# Patient Record
Sex: Female | Born: 1980 | Race: White | Hispanic: No | Marital: Single | Smoking: Never smoker
Health system: Southern US, Community
[De-identification: ages and names within clinical notes are randomized; demographics above are authoritative.]

## PROBLEM LIST (undated history)

## (undated) DIAGNOSIS — Z8719 Personal history of other diseases of the digestive system: Secondary | ICD-10-CM

## (undated) HISTORY — DX: Personal history of other diseases of the digestive system: Z87.19

## (undated) HISTORY — PX: TONSILLECTOMY: SUR1361

---

## 1999-08-27 ENCOUNTER — Other Ambulatory Visit: Admission: RE | Admit: 1999-08-27 | Discharge: 1999-08-27 | Payer: Self-pay | Admitting: *Deleted

## 2001-07-19 ENCOUNTER — Other Ambulatory Visit: Admission: RE | Admit: 2001-07-19 | Discharge: 2001-07-19 | Payer: Self-pay | Admitting: Obstetrics and Gynecology

## 2004-11-02 ENCOUNTER — Ambulatory Visit: Payer: Self-pay | Admitting: Unknown Physician Specialty

## 2005-01-04 HISTORY — PX: TONSILLECTOMY: SUR1361

## 2006-12-04 ENCOUNTER — Ambulatory Visit: Payer: Self-pay | Admitting: Family Medicine

## 2007-11-24 LAB — CONVERTED CEMR LAB: Pap Smear: NORMAL

## 2008-02-24 ENCOUNTER — Emergency Department (HOSPITAL_COMMUNITY): Admission: EM | Admit: 2008-02-24 | Discharge: 2008-02-24 | Payer: Self-pay | Admitting: Emergency Medicine

## 2008-09-27 ENCOUNTER — Ambulatory Visit: Payer: Self-pay | Admitting: Family Medicine

## 2008-09-27 DIAGNOSIS — R5383 Other fatigue: Secondary | ICD-10-CM

## 2008-09-27 DIAGNOSIS — R5381 Other malaise: Secondary | ICD-10-CM | POA: Insufficient documentation

## 2008-09-30 LAB — CONVERTED CEMR LAB
Basophils Absolute: 0 10*3/uL (ref 0.0–0.1)
CO2: 27 meq/L (ref 19–32)
Calcium: 9.3 mg/dL (ref 8.4–10.5)
Cholesterol: 184 mg/dL (ref 0–200)
Creatinine, Ser: 0.9 mg/dL (ref 0.4–1.2)
Eosinophils Absolute: 0.1 10*3/uL (ref 0.0–0.7)
HCT: 43.7 % (ref 36.0–46.0)
LDL Cholesterol: 84 mg/dL (ref 0–99)
Lymphocytes Relative: 24.6 % (ref 12.0–46.0)
Lymphs Abs: 2 10*3/uL (ref 0.7–4.0)
MCHC: 33.8 g/dL (ref 30.0–36.0)
Monocytes Relative: 4.6 % (ref 3.0–12.0)
Platelets: 272 10*3/uL (ref 150.0–400.0)
RDW: 12.1 % (ref 11.5–14.6)
Sodium: 141 meq/L (ref 135–145)
TSH: 0.76 microintl units/mL (ref 0.35–5.50)
Triglycerides: 147 mg/dL (ref 0.0–149.0)

## 2009-01-02 ENCOUNTER — Emergency Department (HOSPITAL_COMMUNITY): Admission: EM | Admit: 2009-01-02 | Discharge: 2009-01-02 | Payer: Self-pay | Admitting: Family Medicine

## 2009-01-04 DIAGNOSIS — Z8711 Personal history of peptic ulcer disease: Secondary | ICD-10-CM

## 2009-01-04 HISTORY — DX: Personal history of peptic ulcer disease: Z87.11

## 2009-01-13 ENCOUNTER — Ambulatory Visit: Payer: Self-pay | Admitting: Family Medicine

## 2009-01-13 DIAGNOSIS — J018 Other acute sinusitis: Secondary | ICD-10-CM | POA: Insufficient documentation

## 2009-01-15 ENCOUNTER — Telehealth: Payer: Self-pay | Admitting: Family Medicine

## 2009-04-21 ENCOUNTER — Telehealth: Payer: Self-pay | Admitting: Internal Medicine

## 2009-04-21 ENCOUNTER — Telehealth (INDEPENDENT_AMBULATORY_CARE_PROVIDER_SITE_OTHER): Payer: Self-pay | Admitting: *Deleted

## 2009-04-22 ENCOUNTER — Ambulatory Visit: Payer: Self-pay | Admitting: Internal Medicine

## 2009-04-22 ENCOUNTER — Encounter (INDEPENDENT_AMBULATORY_CARE_PROVIDER_SITE_OTHER): Payer: Self-pay | Admitting: *Deleted

## 2009-04-22 DIAGNOSIS — R1013 Epigastric pain: Secondary | ICD-10-CM | POA: Insufficient documentation

## 2009-04-22 LAB — CONVERTED CEMR LAB
ALT: 13 units/L (ref 0–35)
AST: 17 units/L (ref 0–37)
BUN: 6 mg/dL (ref 6–23)
Basophils Absolute: 0 10*3/uL (ref 0.0–0.1)
Basophils Relative: 0.4 % (ref 0.0–3.0)
Calcium: 9.3 mg/dL (ref 8.4–10.5)
Creatinine, Ser: 0.9 mg/dL (ref 0.4–1.2)
Eosinophils Absolute: 0.2 10*3/uL (ref 0.0–0.7)
HCT: 40 % (ref 36.0–46.0)
Hemoglobin: 14 g/dL (ref 12.0–15.0)
Lipase: 23 units/L (ref 11.0–59.0)
Lymphs Abs: 2.5 10*3/uL (ref 0.7–4.0)
MCHC: 35.1 g/dL (ref 30.0–36.0)
MCV: 88.2 fL (ref 78.0–100.0)
Monocytes Absolute: 0.4 10*3/uL (ref 0.1–1.0)
Neutro Abs: 5.5 10*3/uL (ref 1.4–7.7)
RBC: 4.54 M/uL (ref 3.87–5.11)
RDW: 12.9 % (ref 11.5–14.6)
Total Bilirubin: 0.3 mg/dL (ref 0.3–1.2)

## 2009-04-23 ENCOUNTER — Ambulatory Visit: Payer: Self-pay | Admitting: Internal Medicine

## 2010-02-03 ENCOUNTER — Ambulatory Visit
Admission: RE | Admit: 2010-02-03 | Discharge: 2010-02-03 | Payer: Self-pay | Source: Home / Self Care | Attending: Family Medicine | Admitting: Family Medicine

## 2010-02-03 DIAGNOSIS — J069 Acute upper respiratory infection, unspecified: Secondary | ICD-10-CM | POA: Insufficient documentation

## 2010-02-03 LAB — CONVERTED CEMR LAB: Rapid Strep: NEGATIVE

## 2010-02-03 NOTE — Miscellaneous (Signed)
Summary: Samples Dexilant  Clinical Lists Changes  Medications: Added new medication of DEXILANT 60 MG CPDR (DEXLANSOPRAZOLE) take one by mouth once daily 

## 2010-02-03 NOTE — Letter (Signed)
Summary: EGD Instructions  [facility]  [address] [place], [place] [address]   Phone: [phone]  Fax: [phone]       Sarah Hill    [birth year]    MRN: [id number]       Procedure Day Dorna Bloom: Wednesday April 20th, 2011     Arrival Time:  3:30pm     Procedure Time: 4:30pm     Location of Procedure:                    _ x _ [facility] (4th Floor)    PREPARATION FOR ENDOSCOPY   On 04/23/09 THE DAY OF THE PROCEDURE:  1.   No solid foods, milk or milk products are allowed after midnight the night before your procedure.  2.   Do not drink anything colored red or purple.  Avoid juices with pulp.  No orange juice.  3.  You may drink clear liquids until 2:30pm, which is 2 hours before your procedure.                                                                                                CLEAR LIQUIDS INCLUDE: Water Jello Ice Popsicles Tea (sugar ok, no milk/cream) Powdered fruit flavored drinks Coffee (sugar ok, no milk/cream) Gatorade Juice: apple, white grape, white cranberry  Lemonade Clear bullion, consomm, broth Carbonated beverages (any kind) Strained chicken noodle soup Hard Candy   MEDICATION INSTRUCTIONS  Unless otherwise instructed, you should take regular prescription medications with a small sip of water as early as possible the morning of your procedure.         OTHER INSTRUCTIONS  You will need a responsible adult at least 32 years of age to accompany you and drive you home.   This person must remain in the waiting room during your procedure.  Wear loose fitting clothing that is easily removed.  Leave jewelry and other valuables at home.  However, you may wish to bring a book to read or an iPod/MP3 player to listen to music as you wait for your procedure to start.  Remove all body piercing jewelry and leave at home.  Total time from sign-in until discharge is approximately 2-3 hours.  You should go home  directly after your procedure and rest.  You can resume normal activities the day after your procedure.  The day of your procedure you should not:   Drive   Make legal decisions   Operate machinery   Drink alcohol   Return to work  You will receive specific instructions about eating, activities and medications before you leave.    The above instructions have been reviewed and explained to me by   Judeth Cornfield.    I fully understand and can verbalize these instructions _____________________________ Date _________

## 2010-02-03 NOTE — Progress Notes (Signed)
Summary: Still sick  Phone Note Call from Patient Call back at [phone] or [phone]   Caller: Patient Call For: Ruthe Mannan MD Summary of Call: Was in on Monday and was told to call back if not better. Patient states that she is feeling some better, but still coughing and coughed up a lot of green mucus and not able to keep anything down except Gatorade.. Patient states that she does not have a fever and has not had one the whole time that she has been sick.  [facility] Initial call taken by: Sydell Axon LPN,  January 15, 2009 12:10 PM  Follow-up for Phone Call        continue the amox as px  increase fluid intake - and consider mucinex to loosen mucous in chest  if no imp by tomorrow -- f/u so we can re check lungs update earlier if symptoms worsen Follow-up by: Judith Part MD,  January 15, 2009 1:18 PM  Additional Follow-up for Phone Call Additional follow up Details #1::        Advised pt. Additional Follow-up by: Lowella Petties CMA,  January 15, 2009 2:22 PM

## 2010-02-03 NOTE — Assessment & Plan Note (Signed)
Summary: EPIGASTRIC/BACK PAIN             (NEW TO GI)         Sarah Hill   History of Present Illness Visit Type: Initial Consult Primary GI MD: Stan Head MD [facility] Primary Provider: Grafton Folk Requesting Provider: Arvilla Meres, MD Chief Complaint: abdominal pain that radiates to back History of Present Illness:   4-5 days ago she noticed an intense heartburn-like pain in the middle of the night. she tried Tums and Zanac without benefit. It has persisted and radiates to back. It hurts while she eats bt then she feels some relief. Has nausea but has not vomited. The original problems started after [place]. She has had similar problems 2 years ago and had an [place] in [place] and no gallstones. Was told it was stress-related then. Is not stress now. Bowels are chronically in an alternating pattern. Very dark stools today x 1. She has not used any NSAID's other than an Aleve 1-2. Recurrent mouth ulcers lately.   GI Review of Systems    Reports abdominal pain, heartburn, and  nausea.     Location of  Abdominal pain: epigastric area.    Denies acid reflux, belching, bloating, chest pain, dysphagia with liquids, dysphagia with solids, loss of appetite, vomiting, vomiting blood, weight loss, and  weight gain.      Reports black tarry stools, change in bowel habits, constipation, and  diarrhea.     Denies anal fissure, diverticulosis, fecal incontinence, heme positive stool, hemorrhoids, irritable bowel syndrome, jaundice, light color stool, liver problems, rectal bleeding, and  rectal pain.    Current Medications (verified): 1)  Kariva 0.15-0.02/0.01 Mg (21/5) Tabs (Desogestrel-Ethinyl Estradiol) .... Take 1 Tablet By Mouth Once A Day 2)  Fish Oil   Oil (Fish Oil) .... Take 1 Capsule By Mouth Once A Day 3)  Multivitamins  Tabs (Multiple Vitamin) .... Once Daily  Allergies (verified): 1)  ! Augmentin  Past History:  Past Medical History: Reviewed history from 09/27/2008 and no  changes required. Unremarkable  Past Surgical History: Reviewed history from 09/27/2008 and no changes required. Tonsillectomy 2008  Family History: Reviewed history from 09/27/2008 and no changes required. HTN- parents CAD- grandmother died of suddent cardiac death at 65 DM- mother  Social History: Lives alone in [place].  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  Wonderful relationship with her mother, Sarah Hill. Occassonal ETOH. Regular exercise No drug use Daily Caffeine Use  Review of Systems       The patient complains of allergy/sinus, arthritis/joint pain, headaches-new, and heart murmur.         All other ROS negative except as per HPI.   Vital Signs:  Patient profile:   30 year old female Height:      60 inches Weight:      131.13 pounds BMI:     25.70 Pulse rate:   100 / minute Pulse rhythm:   regular BP sitting:   122 / 80  (left arm) Cuff size:   regular  Vitals Entered By: June McMurray CMA Duncan Dull) (April 22, 2009 3:16 PM)  Physical Exam  General:  Well developed, well nourished, no acute distress. Eyes:  PERRLA, no icterus. Mouth:  ulcer lowr lip area in front of teeth Neck:  Supple; no masses or thyromegaly. Lungs:  Clear throughout to auscultation. Heart:  Regular rate and rhythm; no murmurs, rubs,  or bruits. Abdomen:  soft tender in epigastrium without HSM or mass she is not  tender on xiphoid or with mscle tenson BS+ Rectal:  female staff present scanty greensh stool heme - Extremities:  No clubbing, cyanosis, edema or deformities noted. Neurologic:  Alert and  oriented x4;  grossly normal neurologically. Cervical Nodes:  No significant cervical or supraclavicular adenopathy.  Psych:  Alert and cooperative. Normal mood and affect.   Impression & Recommendations:  Problem # 1:  EPIGASTRIC PAIN (ICD-789.06) Assessment New Sounds most like ulcer pain as it seems to get better with food. Some ? of melena but not seen on exam  today. Prior US 2 years ago negative. Risks, benefits,and indications of endoscopic procedure(s) were reviewed with the patient and all questions answered.  Orders: EGD (EGD) TLB-CBC Platelet - w/Differential (85025-CBCD) TLB-CMP (Comprehensive Metabolic Pnl) (80053-COMP) TLB-Amylase (82150-AMYL) TLB-Lipase (83690-LIPASE)  Patient Instructions: 1)  Take Dexilant 60 mg today and tomorrow AM. 2)  Please go to the basement to have your lab tests drawn today. 3)  CBC, CMET, amylase and lipase ordered. 4)  See you at your EGD tomorrow. 5)  Copy sent to : Enos Fling, MD and Nicholes Mango, MD 6)  The medication list was reviewed and reconciled.  All changed / newly prescribed medications were explained.  A complete medication list was provided to the patient / caregiver.

## 2010-02-03 NOTE — Procedures (Signed)
Summary: Upper Endoscopy  Patient: Sarah Hill Note: All result statuses are Final unless otherwise noted.  Tests: (1) Upper Endoscopy (EGD)   EGD Upper Endoscopy       DONE     [facility]     [address]     [place], [place]  [address]           ENDOSCOPY PROCEDURE REPORT           PATIENT:  Sarah Hill, Sarah Hill  MR#:  [id number]     BIRTHDATE:  Mar 14, 1980, 28 yrs. old  GENDER:  female           ENDOSCOPIST:  Iva Boop, MD, [facility]     Referred by:  Bevelyn Buckles. Bensimhon, M.D.           PROCEDURE DATE:  04/23/2009     PROCEDURE:  EGD with biopsy     ASA CLASS:  Class I     INDICATIONS:  epigastric pain           MEDICATIONS:   Fentanyl 100 mcg IV, Versed 10 mg     TOPICAL ANESTHETIC:  Exactacain Spray           DESCRIPTION OF PROCEDURE:   After the risks benefits and     alternatives of the procedure were thoroughly explained, informed     consent was obtained.  The [id number] endoscope was introduced     through the mouth and advanced to the second portion of the     duodenum, without limitations.  The instrument was slowly     withdrawn as the mucosa was fully examined.     <<PROCEDUREIMAGES>>           Multiple ulcers were found. Two antral ulcers, one round and 1 cm     maximum diameter and ther other irregular sheape but maximum 1 cm.     Both are clean-based. Multiple biopsies were obtained and sent to     pathology.  Moderate gastritis was found in the antrum.  Otherwise     the examination was normal.    Retroflexed views revealed no     abnormalities.    The scope was then withdrawn from the patient     and the procedure completed.           COMPLICATIONS:  None           ENDOSCOPIC IMPRESSION:     1) Two gastric antral ulcers - biopsied     2) Antral gastritis     3) Otherwise normal examination     RECOMMENDATIONS:     1) Await biopsy results     Dexilant 60 mg daily x 2 months     Treat H. pylori if present.           REPEAT EXAM:  as  needed           Iva Boop, MD, [facility]           CC:  Dolores Patty, MD     Ruthe Mannan MD     The Patient           n.     Rosalie Doctor:   Iva Boop at 04/23/2009 05:08 PM           [place], [id number]  Note: An exclamation mark (!) indicates a result that was not dispersed into the flowsheet. Document Creation Date: 04/23/2009 5:08 PM _______________________________________________________________________  (1)  Order result status: Final Collection or observation date-time: 04/23/2009 16:51 Requested date-time:  Receipt date-time:  Reported date-time:  Referring Physician:   Ordering Physician: Stan Head [phone]) Specimen Source:  Source: Launa Grill Order Number: [id number] Lab site:   Appended Document: Upper Endoscopy   EGD  Procedure date:  04/23/2009  Findings:          1) Two gastric antral ulcers - biopsied ULCER, NO H PYLORI     3) Otherwise normal examination

## 2010-02-03 NOTE — Progress Notes (Signed)
Summary: ? 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  Pt states that pain worsens with eating and has not been relieved by any OTC medications (pepto, zantac, etc.).  Pt discussed issues with DOD (Dr. Gala Romney) today and Dr. Gala Romney concerned for possible need for GI workup.  GI called to arrange initial appt.   Initial call taken by: Charlena Cross, RN, BSN,  April 21, 2009 2:29 PM

## 2010-02-03 NOTE — Progress Notes (Signed)
Summary: Needs appt. ASAP  Phone Note From Other Clinic   Caller: Melissa @ [facility]  [phone] Summary of Call: Upper gastric pain/back pain--needs pt. seen today.  Initial call taken by: Karna Christmas,  April 21, 2009 11:08 AM  Follow-up for Phone Call        Pt. will see Willette Cluster NP today at 2:30pm. Efraim Kaufmann will advise pt. of aqppt/med.list/co-pay. Follow-up by: Laureen Ochs LPN,  April 21, 2009 12:18 PM     Appended Document: Needs appt. ASAP Dr.Bensihmon called also and requested pt. be seen tomorrow by Dr.Gessner and Dr.Gessner consulted and he will see pt. tomorrow at 3:30 p.m.

## 2010-02-03 NOTE — Assessment & Plan Note (Signed)
Summary: follow up urgent care cough/sob/sinus infection/rbh   Vital Signs:  Patient profile:   30 year old female Height:      60 inches Weight:      132 pounds BMI:     25.87 Temp:     98.5 degrees F oral Pulse rate:   84 / minute Pulse rhythm:   regular BP sitting:   108 / 88  (left arm) Cuff size:   regular  Vitals Entered By: Delilah Shan CMA Duncan Dull) (January 13, 2009 8:18 AM) CC: Congestion, cough.   History of Present Illness: 30 yo healthy female with almost 2 weeks of nasa congestion, dry cough, swollen glands. Went to urgent care on New Years Eve- rapid strep negative, told it was allergies and given flonase. Felt things were getting better but developed chills and body aches a couple of days ago.  Feels dizzy, short of breath, and fatigued with exertion.  No h/o asthma or allergic rhinitis.  Of note, NKDA but Augmentin upsets her stomach.  Current Medications (verified): 1)  Kariva 0.15-0.02/0.01 Mg (21/5) Tabs (Desogestrel-Ethinyl Estradiol) .... Take 1 Tablet By Mouth Once A Day 2)  Fish Oil   Oil (Fish Oil) .... Take 1 Capsule By Mouth Once A Day 3)  Amoxicillin 500 Mg Tabs (Amoxicillin) .Marland Kitchen.. 1 Tab By Mouth Two Times A Day X 10 Days 4)  Cheratussin Ac 100-10 Mg/48ml Syrp (Guaifenesin-Codeine) .... 5 Ml By Mouth At Bedtime As Needed Cough  Allergies (verified): No Known Drug Allergies  Review of Systems      See HPI General:  Complains of chills, malaise, and sweats; denies fever. ENT:  Complains of nasal congestion and sinus pressure. Resp:  Complains of cough and shortness of breath; denies sputum productive and wheezing. GI:  Denies nausea and vomiting.  Physical Exam  General:  Well-developed,well-nourished,in no acute distress; alert,appropriate and cooperative throughout examination, very pleasant. Ears:  clear fluid bilaterally Nose:  nasal dischargemucosal pallor.   + maxillary and frontal sinuses. Mouth:  Oral mucosa and oropharynx without  lesions or exudates.  Teeth in good repair. No pallor. Neck:  pos left post cervical adenopathy, non tender Lungs:  Normal respiratory effort, chest expands symmetrically. Lungs are clear to auscultation, no crackles or wheezes. Heart:  normal rate, regular rhythm, and grade  2/6 systolic murmur while seated, no LE edema. Psych:  normally interactive, good eye contact, not anxious appearing, and not depressed appearing.     Impression & Recommendations:  Problem # 1:  OTHER ACUTE SINUSITIS (ICD-461.8) Assessment New Given duration of symptoms with acute worsening, most likely bacterial sinusitis. Treat with 10 day course of Amoxicillin. Continue supportive care-- see patient instrucitons for details. Her updated medication list for this problem includes:    Amoxicillin 500 Mg Tabs (Amoxicillin) .Marland Kitchen... 1 tab by mouth two times a day x 10 days    Cheratussin Ac 100-10 Mg/58ml Syrp (Guaifenesin-codeine) .Marland KitchenMarland KitchenMarland KitchenMarland Kitchen 5 ml by mouth at bedtime as needed cough  Complete Medication List: 1)  Kariva 0.15-0.02/0.01 Mg (21/5) Tabs (Desogestrel-ethinyl estradiol) .... Take 1 tablet by mouth once a day 2)  Fish Oil Oil (Fish oil) .... Take 1 capsule by mouth once a day 3)  Amoxicillin 500 Mg Tabs (Amoxicillin) .Marland Kitchen.. 1 tab by mouth two times a day x 10 days 4)  Cheratussin Ac 100-10 Mg/18ml Syrp (Guaifenesin-codeine) .... 5 ml by mouth at bedtime as needed cough  Patient Instructions: 1)  Recommended voice rest for laryngitis, Warm honey tea.  Drink lots  of fluids.  Treat sympotmatically  with mucinex, flonase, and Tylenol.  Cough suppressant at night.  2)  Call if not improving 2-3 days. Prescriptions: CHERATUSSIN AC 100-10 MG/5ML SYRP (GUAIFENESIN-CODEINE) 5 ml by mouth at bedtime as needed cough  #4 ounces x 0   Entered and Authorized by:   Ruthe Mannan MD   Signed by:   Ruthe Mannan MD on 01/13/2009   Method used:   Print then Give to Patient   RxID:   [id number] AMOXICILLIN 500 MG TABS (AMOXICILLIN) 1  tab by mouth two times a day x 10 days  #20 x 0   Entered and Authorized by:   Ruthe Mannan MD   Signed by:   Ruthe Mannan MD on 01/13/2009   Method used:   [facility] to        [id number]       [address]       [place], [place]  [address]       Ph: [phone]       Fax: [phone]   RxID:   [id number]   Current Allergies (reviewed today): No known allergies

## 2010-02-11 NOTE — Assessment & Plan Note (Signed)
Summary: ST/CLE   Vital Signs:  Patient profile:   30 year old female Height:      60 inches Weight:      139.50 pounds BMI:     27.34 Temp:     97.9 degrees F oral Pulse rate:   88 / minute Pulse rhythm:   regular BP sitting:   130 / 80  (left arm) Cuff size:   regular  Vitals Entered By: Linde Gillis CMA Duncan Dull) (February 03, 2010 8:58 AM) CC: sore throat, fever, body aches   History of Present Illness: 30 yo healthy female with 1 day of sore throat, fever Tmax 101 and body aches. Feels a little sinus congestion. No cough, no SOB. No ear pain, does have some swollen lymph nodes in her neck.  No h/o asthma or allergic rhinitis.  Of note, NKDA but Augmentin upsets her stomach.  Current Medications (verified): 1)  Kariva 0.15-0.02/0.01 Mg (21/5) Tabs (Desogestrel-Ethinyl Estradiol) .... Take 1 Tablet By Mouth Once A Day 2)  Fish Oil   Oil (Fish Oil) .... Take 1 Capsule By Mouth Once A Day 3)  Multivitamins  Tabs (Multiple Vitamin) .... Once Daily 4)  Dexilant 60 Mg Cpdr (Dexlansoprazole) .... Take One By Mouth Once Daily  Allergies: 1)  ! Augmentin  Past History:  Past Medical History: Last updated: 09/27/2008 Unremarkable  Past Surgical History: Last updated: 09/27/2008 Tonsillectomy 2008  Family History: Last updated: 09/27/2008 HTN- parents CAD- grandmother died of suddent cardiac death at 5 DM- mother  Social History: Last updated: 04/22/2009 Lives alone in [place].  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  Wonderful relationship with her mother, Markee Remlinger. Occassonal ETOH. Regular exercise No drug use Daily Caffeine Use  Risk Factors: Alcohol Use: <1 (09/27/2008)  Review of Systems      See HPI General:  Complains of fever. ENT:  Complains of nasal congestion and sore throat; denies sinus pressure. CV:  Denies chest pain or discomfort. Resp:  Denies cough.  Physical Exam  General:  Well-developed,well-nourished,in no acute  distress; alert,appropriate and cooperative throughout examination, very pleasant. sounds a little congested Ears:  R ear normal and L ear normal.   Nose:  slight mucosal erythema, no edema Mouth:  +PND, no exudates. Lungs:  Normal respiratory effort, chest expands symmetrically. Lungs are clear to auscultation, no crackles or wheezes. Heart:  normal rate, regular rhythm, and grade  2/6 systolic murmur while seated, no LE edema. Cervical Nodes:  L posterior LN tender.   Psych:  normally interactive, good eye contact, not anxious appearing, and not depressed appearing.     Impression & Recommendations:  Problem # 1:  URI (ICD-465.9) Assessment New Rapid strep negative. Symptoms just started yesterday, likely viral. Advised supportive care with Tylenol, Mucinex, OTC decongestants.  Pt to call if no improvement in next 5-7 days.  Complete Medication List: 1)  Kariva 0.15-0.02/0.01 Mg (21/5) Tabs (Desogestrel-ethinyl estradiol) .... Take 1 tablet by mouth once a day 2)  Fish Oil Oil (Fish oil) .... Take 1 capsule by mouth once a day 3)  Multivitamins Tabs (Multiple vitamin) .... Once daily 4)  Dexilant 60 Mg Cpdr (Dexlansoprazole) .... Take one by mouth once daily  Other Orders: Rapid Strep ([address])   Orders Added: 1)  Rapid Strep [60454] 2)  Est. Patient Level III [09811]    Current Allergies (reviewed today): ! AUGMENTIN  Laboratory Results    Other Tests  Rapid Strep: negative  Kit Test Internal QC: Positive   (  Normal Range: Negative)

## 2010-03-24 ENCOUNTER — Other Ambulatory Visit: Payer: Self-pay | Admitting: Internal Medicine

## 2010-03-25 MED ORDER — DEXLANSOPRAZOLE 60 MG PO CPDR: 60.0000 mg | DELAYED_RELEASE_CAPSULE | Freq: Every day | ORAL | Status: DC

## 2010-03-25 NOTE — Telephone Encounter (Signed)
Not able to refill Epic has my title wrong in system.

## 2010-04-06 LAB — POCT RAPID STREP A (OFFICE): Streptococcus, Group A Screen (Direct): NEGATIVE

## 2010-06-05 ENCOUNTER — Telehealth: Payer: Self-pay | Admitting: Internal Medicine

## 2010-06-05 NOTE — Telephone Encounter (Signed)
Patient called to state she is having problems with her knees and Tylenol is not helping.  She wants to know what else she can take OTC that is not an NSAID as they have caused her stomach pains int he past.  Patient is advised that she should avoid NSAIDS if possible and contact her primary care for an alternate pain meds. She is advised if they prescribe a NSAID she needs to make sure that she is staying on her PPI.  She will call back for further problems or questions.

## 2010-06-08 NOTE — Telephone Encounter (Signed)
Left message for patient to call back  

## 2010-06-08 NOTE — Telephone Encounter (Signed)
Patient advised she wants to call back to schedule an appt.

## 2010-06-08 NOTE — Telephone Encounter (Signed)
This is correct advice Review of Centricity notes indicates that I had recommended she follow-up with me about the gastric ulcers (last summer).  It was not certain that she would need chronic Dexilant and though she needs a PPI if taking NSAID's - not necessarily forever.  I recommend she schedule a non-urgent REV with me to review these issues (next avail should be ok). She can follow your advice til then.

## 2010-07-17 ENCOUNTER — Encounter: Payer: Self-pay | Admitting: Family Medicine

## 2010-07-21 ENCOUNTER — Ambulatory Visit (INDEPENDENT_AMBULATORY_CARE_PROVIDER_SITE_OTHER): Payer: 59 | Admitting: Family Medicine

## 2010-07-21 ENCOUNTER — Encounter: Payer: Self-pay | Admitting: Family Medicine

## 2010-07-21 VITALS — BP 120/78 | HR 66 | Temp 97.9°F | Ht 60.0 in | Wt 139.4 lb

## 2010-07-21 DIAGNOSIS — R635 Abnormal weight gain: Secondary | ICD-10-CM | POA: Insufficient documentation

## 2010-07-21 MED ORDER — PHENTERMINE HCL 37.5 MG PO TABS: ORAL_TABLET | ORAL | Status: DC

## 2010-07-21 NOTE — Progress Notes (Signed)
  Subjective:    Patient ID: Sarah Hill, female    DOB: [birth year], 30 y.o.   MRN: [id number]  HPI 30 yo here to discuss starting an appetite suppressant.  Goes to gym several days per week, has a Systems analyst. Very careful with watching what she eats.  Feels like her weight has continued to increase over past several months, has gained 8-10 pounds.  Wt Readings from Last 3 Encounters:  07/21/10 139 lb 6.4 oz (63.231 kg)  02/03/10 139 lb 8 oz (63.277 kg)  04/22/09 131 lb 2.1 oz (59.481 kg)     Several years ago, tried an appetite suppressant and helped to give her a boost.  Did have have chest pain, palpitations or SOB with appetite suppressant in past.   Review of Systems See HPI    Objective:   Physical Exam BP 120/78  Pulse 66  Temp(Src) 97.9 F (36.6 C) (Oral)  Ht 5' (1.524 m)  Wt 139 lb 6.4 oz (63.231 kg)  BMI 27.22 kg/m2  General:  Well-developed,well-nourished,in no acute distress; alert,appropriate and cooperative throughout examination Head:  normocephalic and atraumatic.   Nose:  no external deformity.   Mouth:  good dentition.   Neck:  No deformities, masses, or tenderness noted. Breasts:  No mass, nodules, thickening, tenderness, bulging, retraction, inflamation, nipple discharge or skin changes noted.   Lungs:  Normal respiratory effort, chest expands symmetrically. Lungs are clear to auscultation, no crackles or wheezes. Heart:  Normal rate and regular rhythm. S1 and S2 normal without gallop, murmur, click, rub or other extra sounds. Msk:  No deformity or scoliosis noted of thoracic or lumbar spine.   Extremities:  No clubbing, cyanosis, edema, or deformity noted with normal full range of motion of all joints.   Neurologic:  alert & oriented X3 and gait normal.   Skin:  Intact without suspicious lesions or rashes Psych:  Cognition and judgment appear intact. Alert and cooperative with normal attention span and concentration. No apparent delusions,  illusions, hallucinations        Assessment & Plan:   1. Weight gain    New. Will start her on phenteramine as she is a good candidate- healthy, active with on comorbidities. Discussed risks of medication, including pulmonary HTN.  She is aware and would still like to try it. Pt to follow up in one month for BP recheck and weight check.

## 2010-09-18 ENCOUNTER — Ambulatory Visit (INDEPENDENT_AMBULATORY_CARE_PROVIDER_SITE_OTHER): Payer: 59 | Admitting: Family Medicine

## 2010-09-18 ENCOUNTER — Encounter: Payer: Self-pay | Admitting: Family Medicine

## 2010-09-18 VITALS — BP 126/82 | HR 91 | Temp 98.4°F | Wt 137.0 lb

## 2010-09-18 DIAGNOSIS — R635 Abnormal weight gain: Secondary | ICD-10-CM

## 2010-09-18 MED ORDER — PHENTERMINE HCL 37.5 MG PO TABS: ORAL_TABLET | ORAL | Status: DC

## 2010-09-18 NOTE — Patient Instructions (Signed)
Great job!  You look fantastic. Please come back to see me in one month.

## 2010-09-18 NOTE — Progress Notes (Signed)
  Subjective:    Patient ID: Sarah Hill, female    DOB: [birth year], 30 y.o.   MRN: [id number]  HPI 30 yo here to follow up weight management.  Goes to gym several days per week, has a Systems analyst. Very careful with watching what she eats.  Started phentermine July 2012. Although she has only lost 2 pounds, she is quite pleased with how it is working. Has not taken it for past 3 weeks and she was vacationing recently so she is pleased with current weight.  No CP, SOB, palpitations or other side effects.    Wt Readings from Last 3 Encounters:  09/18/10 137 lb (62.143 kg)  07/21/10 139 lb 6.4 oz (63.231 kg)  02/03/10 139 lb 8 oz (63.277 kg)    Review of Systems See HPI    Objective:   Physical Exam BP 126/82  Pulse 91  Temp(Src) 98.4 F (36.9 C) (Oral)  Wt 137 lb (62.143 kg)  LMP 09/10/2010  General:  Well-developed,well-nourished,in no acute distress; alert,appropriate and cooperative throughout examination Head:  normocephalic and atraumatic.   Nose:  no external deformity.   Mouth:  good dentition.   Lungs:  Normal respiratory effort, chest expands symmetrically. Lungs are clear to auscultation, no crackles or wheezes. Heart:  Normal rate and regular rhythm. S1 and S2 normal without gallop, murmur, click, rub or other extra sounds. Neurologic:  alert & oriented X3 and gait normal.   Skin:  Intact without suspicious lesions or rashes Psych:  Cognition and judgment appear intact. Alert and cooperative with normal attention span and concentration. No apparent delusions, illusions, hallucinations      Assessment & Plan:   1. Weight gain   Improved.  VSS. Continue phentermine at current dose. See patient instructions for details.

## 2010-12-03 ENCOUNTER — Ambulatory Visit (INDEPENDENT_AMBULATORY_CARE_PROVIDER_SITE_OTHER): Payer: 59 | Admitting: Family Medicine

## 2010-12-03 ENCOUNTER — Encounter: Payer: Self-pay | Admitting: Family Medicine

## 2010-12-03 VITALS — BP 130/82 | HR 72 | Temp 98.9°F | Ht 60.0 in | Wt 138.0 lb

## 2010-12-03 DIAGNOSIS — R6889 Other general symptoms and signs: Secondary | ICD-10-CM

## 2010-12-03 DIAGNOSIS — R635 Abnormal weight gain: Secondary | ICD-10-CM

## 2010-12-03 LAB — CBC WITH DIFFERENTIAL/PLATELET
Basophils Absolute: 0 10*3/uL (ref 0.0–0.1)
Eosinophils Absolute: 0.1 10*3/uL (ref 0.0–0.7)
HCT: 41 % (ref 36.0–46.0)
Hemoglobin: 14.1 g/dL (ref 12.0–15.0)
Lymphs Abs: 1.6 10*3/uL (ref 0.7–4.0)
MCHC: 34.3 g/dL (ref 30.0–36.0)
Neutro Abs: 6.2 10*3/uL (ref 1.4–7.7)
Platelets: 278 10*3/uL (ref 150.0–400.0)
RDW: 13.6 % (ref 11.5–14.6)

## 2010-12-03 LAB — TSH: TSH: 1.27 u[IU]/mL (ref 0.35–5.50)

## 2010-12-03 LAB — T4, FREE: Free T4: 0.8 ng/dL (ref 0.60–1.60)

## 2010-12-03 NOTE — Progress Notes (Signed)
  Subjective:    Patient ID: Sarah Hill, female    DOB: 1980-11-03, 30 y.o.   MRN: [id number]  HPI 30 yo here to discuss cold intolerance.    Always feels like she is the coldest person in the room but past several months, she feels it is worse. Has also had more constipation. Had been on phentermine recently for weight gain, stopped taking it last month and weight has been stable.  Wt Readings from Last 3 Encounters:  12/03/10 138 lb (62.596 kg)  09/18/10 137 lb (62.143 kg)  07/21/10 139 lb 6.4 oz (63.231 kg)   No palpitations, SOB, abdominal pain.  Has noticed her finger tips often very cold, purplish color- very sensitive to changes in temperature.  Review of Systems See HPI    Objective:   Physical Exam BP 130/82  Pulse 72  Temp(Src) 98.9 F (37.2 C) (Oral)  Ht 5' (1.524 m)  Wt 138 lb (62.596 kg)  BMI 26.95 kg/m2  LMP 11/02/2010  General:  Well-developed,well-nourished,in no acute distress; alert,appropriate and cooperative throughout examination Head:  normocephalic and atraumatic.   HEENT:  Enlarged thyroid, no nodules, non tender to palp Nose:  no external deformity.   Mouth:  good dentition.   Lungs:  Normal respiratory effort, chest expands symmetrically. Lungs are clear to auscultation, no crackles or wheezes. Heart:  Normal rate and regular rhythm. S1 and S2 normal without gallop, murmur, click, rub or other extra sounds. Neurologic:  alert & oriented X3 and gait normal.   Skin:  Intact without suspicious lesions or rashes Psych:  Cognition and judgment appear intact. Alert and cooperative with normal attention span and concentration. No apparent delusions, illusions, hallucinations     Assessment & Plan:   1. Cold intolerance  TSH, T4, free, CBC w/Diff   Deteriorated. ?possible thyroid dysfunction. Will check labs today.

## 2010-12-03 NOTE — Patient Instructions (Signed)
Good to see you. We will call you with your lab results today or tomorrow Have a great day!

## 2010-12-07 ENCOUNTER — Other Ambulatory Visit: Payer: Self-pay | Admitting: *Deleted

## 2010-12-07 MED ORDER — PHENTERMINE HCL 37.5 MG PO TABS: ORAL_TABLET | ORAL | Status: DC

## 2010-12-07 NOTE — Telephone Encounter (Signed)
Rx called to [facility], left message on cell phone voicemail advising patient as instructed.

## 2010-12-07 NOTE — Telephone Encounter (Signed)
Patient called requesting a refill on her medication. Please advise.

## 2011-03-25 ENCOUNTER — Ambulatory Visit (INDEPENDENT_AMBULATORY_CARE_PROVIDER_SITE_OTHER): Payer: 59 | Admitting: Family Medicine

## 2011-03-25 ENCOUNTER — Encounter: Payer: Self-pay | Admitting: Family Medicine

## 2011-03-25 VITALS — BP 102/68 | HR 82 | Temp 98.0°F | Ht 60.0 in | Wt 145.2 lb

## 2011-03-25 DIAGNOSIS — J069 Acute upper respiratory infection, unspecified: Secondary | ICD-10-CM

## 2011-03-25 MED ORDER — GUAIFENESIN-CODEINE 100-10 MG/5ML PO SYRP: 5.0000 mL | ORAL_SOLUTION | Freq: Four times a day (QID) | ORAL | Status: AC | PRN

## 2011-03-25 NOTE — Assessment & Plan Note (Signed)
Neg rapid strep test  Suspect viral  Disc symptomatic care - see instructions on AVS  Will try robitussin with cod for nightime cough Update if not starting to improve in a week or if worsening

## 2011-03-25 NOTE — Patient Instructions (Signed)
Drink lots of fluids Tylenol for sore throat or fever  Try the robitussin/ codeine at night for cough  mucinex DM is ok for day time Update if not starting to improve in a week or if worsening

## 2011-03-25 NOTE — Progress Notes (Signed)
  Subjective:    Patient ID: Sarah Hill, female    DOB: May 03, 1980, 31 y.o.   MRN: [id number]  HPI Here for [place] to bed with ST on Sunday Now cough dry with burning in her chest  St is worse  No runny or stuffy nose or post nasal drip   Monday - had body aches- that is better   No rash   No strep contacts   Patient Active Problem List  Diagnoses  . OTHER ACUTE SINUSITIS  . FATIGUE  . EPIGASTRIC PAIN  . URI  . Weight gain  . Cold intolerance   No past medical history on file. Past Surgical History  Procedure Date  . Tonsillectomy    History  Substance Use Topics  . Smoking status: Never Smoker   . Smokeless tobacco: Not on file  . Alcohol Use: Yes   Family History  Problem Relation Age of Onset  . Hypertension Mother   . Diabetes Mother   . Hypertension Father   . Hearing loss Maternal Grandmother    Allergies  Allergen Reactions  . HYQ:MVHQIONGEXB+MWUXLKGMW+NUUVOZDGUY Acid+Aspartame    Current Outpatient Prescriptions on File Prior to Visit  Medication Sig Dispense Refill  . desogestrel-ethinyl estradiol (KARIVA) 0.15-0.02/0.01 MG (21/5) per tablet Take 1 tablet by mouth daily.        . Multiple Vitamin (MULTIVITAMIN) tablet Take 1 tablet by mouth daily.        . Omega-3 Fatty Acids (FISH OIL) 1000 MG CAPS Take 1 capsule by mouth as needed.           Review of Systems Review of Systems  Constitutional: Negative for fever, appetite change, and unexpected weight change. (low grade fever several days ago?) Eyes: Negative for pain and visual disturbance.  ENt pos for st/ post nasal drip/ neg for sinus pain  Respiratory: Negative for sob- but pos for cough and occ wheeze   Cardiovascular: Negative for cp or palpitations    Gastrointestinal: Negative for nausea, diarrhea and constipation.  Genitourinary: Negative for urgency and frequency.  Skin: Negative for pallor or rash   Neurological: Negative for weakness, light-headedness, numbness and  headaches.  Hematological: Negative for adenopathy. Does not bruise/bleed easily.  Psychiatric/Behavioral: Negative for dysphoric mood. The patient is not nervous/anxious.          Objective:   Physical Exam  Constitutional: She appears well-developed and well-nourished. No distress.  HENT:  Head: Normocephalic and atraumatic.  Right Ear: External ear normal.  Left Ear: External ear normal.  Mouth/Throat: Oropharynx is clear and moist. No oropharyngeal exudate.       Nares are boggy with clear rhinorrhea Post throat- erythema No swelling or exudate  Eyes: Conjunctivae and EOM are normal. Pupils are equal, round, and reactive to light. No scleral icterus.  Neck: Normal range of motion. Neck supple. No thyromegaly present.  Cardiovascular: Normal rate, regular rhythm and normal heart sounds.   Pulmonary/Chest: Effort normal and breath sounds normal. No respiratory distress. She has no wheezes. She has no rales. She exhibits no tenderness.  Lymphadenopathy:    She has no cervical adenopathy.  Neurological: She is alert.  Skin: Skin is warm and dry. No rash noted. No erythema. No pallor.  Psychiatric: She has a normal mood and affect.          Assessment & Plan:

## 2011-09-24 ENCOUNTER — Ambulatory Visit: Payer: 59 | Admitting: Cardiovascular Disease

## 2012-02-09 ENCOUNTER — Encounter: Payer: Self-pay | Admitting: Family Medicine

## 2012-02-09 ENCOUNTER — Ambulatory Visit (INDEPENDENT_AMBULATORY_CARE_PROVIDER_SITE_OTHER): Payer: 59 | Admitting: Family Medicine

## 2012-02-09 VITALS — BP 118/82 | HR 104 | Temp 98.3°F | Wt 147.0 lb

## 2012-02-09 DIAGNOSIS — J069 Acute upper respiratory infection, unspecified: Secondary | ICD-10-CM

## 2012-02-09 MED ORDER — AZITHROMYCIN 250 MG PO TABS: ORAL_TABLET | ORAL | Status: DC

## 2012-02-09 MED ORDER — FLUTICASONE PROPIONATE 50 MCG/ACT NA SUSP: 2.0000 | Freq: Every day | NASAL | Status: DC

## 2012-02-09 NOTE — Addendum Note (Signed)
Addended by: Eliezer Bottom on: 02/09/2012 04:56 PM   Modules accepted: Orders

## 2012-02-09 NOTE — Progress Notes (Signed)
SUBJECTIVE:  Sarah Hill is a 32 y.o. female who present complaining of flu-like symptoms: fevers, chills, myalgias, congestion, ear pain sore throat and cough for 3 days. Denies dyspnea or wheezing.  Symptoms abruptly became worse last night.  Afebrile here but has been taking Ibuprofen for body aches.  She did have her flu shot this year.  Patient Active Problem List  Diagnosis  . FATIGUE  . Weight gain  . Cold intolerance   No past medical history on file. Past Surgical History  Procedure Date  . Tonsillectomy    History  Substance Use Topics  . Smoking status: Never Smoker   . Smokeless tobacco: Not on file  . Alcohol Use: Yes   Family History  Problem Relation Age of Onset  . Hypertension Mother   . Diabetes Mother   . Hypertension Father   . Hearing loss Maternal Grandmother    Allergies  Allergen Reactions  . Amoxicillin-Pot Clavulanate    Current Outpatient Prescriptions on File Prior to Visit  Medication Sig Dispense Refill  . desogestrel-ethinyl estradiol (KARIVA) 0.15-0.02/0.01 MG (21/5) per tablet Take 1 tablet by mouth daily.        Marland Kitchen dexlansoprazole (DEXILANT) 60 MG capsule Take 60 mg by mouth daily.      . Multiple Vitamin (MULTIVITAMIN) tablet Take 1 tablet by mouth daily.        . Omega-3 Fatty Acids (FISH OIL) 1000 MG CAPS Take 1 capsule by mouth as needed.        The PMH, PSH, Social History, Family History, Medications, and allergies have been reviewed in [facility], and have been updated if relevant.  OBJECTIVE: BP 118/82  Pulse 104  Temp 98.3 F (36.8 C)  Wt 147 lb (66.679 kg)  SpO2 97%  Appears moderately ill but not toxic; temperature as noted in vitals. Ears normal. Throat and pharynx normal.  Neck supple. No adenopathy in the neck. Sinuses non tender. The chest is clear.  ASSESSMENT: Sinusitis  PLAN: Flu swab neg.  Likely viral but given abrupt worsening of sinus pressure, given rx for zpack to take if symptoms do not improve over next  several days.  Start flonase. Symptomatic therapy suggested: rest, increase fluids and call prn if symptoms persist or worsen. Call or return to clinic prn if these symptoms worsen or fail to improve as anticipated.

## 2012-02-09 NOTE — Patient Instructions (Addendum)
Take zpack as directed if your symptoms do not improve.  Drink lots of fluids.  Ibuprofen 800 mg three daily with food until body aches resolve.  You can use warm compresses.    Call if not improving as expected in 5-7 days.

## 2012-02-19 ENCOUNTER — Other Ambulatory Visit: Payer: Self-pay

## 2012-05-25 ENCOUNTER — Telehealth: Payer: Self-pay | Admitting: Family Medicine

## 2012-05-25 NOTE — Telephone Encounter (Signed)
Pt is dr Sarah Hill pt at [facility] and would like to start seeing you.  She stated she has not problems with dr Sharen Hones are closer, and she works at [employer]

## 2012-05-25 NOTE — Telephone Encounter (Signed)
Appointment 7/25  Pt aware

## 2012-05-25 NOTE — Telephone Encounter (Signed)
Fine to put her in any 30-58min slot.

## 2012-06-09 ENCOUNTER — Ambulatory Visit: Payer: 59 | Admitting: Cardiovascular Disease

## 2012-07-24 ENCOUNTER — Encounter: Payer: Self-pay | Admitting: Internal Medicine

## 2012-07-28 ENCOUNTER — Ambulatory Visit: Payer: 59 | Admitting: Internal Medicine

## 2012-09-15 ENCOUNTER — Ambulatory Visit: Payer: 59 | Admitting: Internal Medicine

## 2012-10-27 ENCOUNTER — Ambulatory Visit: Payer: 59 | Admitting: Internal Medicine

## 2012-11-09 ENCOUNTER — Other Ambulatory Visit: Payer: Self-pay

## 2012-12-14 ENCOUNTER — Encounter: Payer: Self-pay | Admitting: Internal Medicine

## 2012-12-15 ENCOUNTER — Ambulatory Visit: Payer: 59 | Admitting: Internal Medicine

## 2012-12-18 ENCOUNTER — Other Ambulatory Visit: Payer: Self-pay | Admitting: Family Medicine

## 2012-12-18 NOTE — Telephone Encounter (Signed)
[facility] notified [facility].  Patient needs an appointment.

## 2012-12-18 NOTE — Telephone Encounter (Signed)
Last office visit 02/09/2012.  Not on current medication list.  Ok to refill?

## 2013-07-02 ENCOUNTER — Other Ambulatory Visit (HOSPITAL_COMMUNITY): Payer: Self-pay | Admitting: Orthopedic Surgery

## 2013-07-02 DIAGNOSIS — M25551 Pain in right hip: Secondary | ICD-10-CM

## 2013-07-13 ENCOUNTER — Ambulatory Visit (HOSPITAL_COMMUNITY): Payer: 59

## 2014-01-04 HISTORY — PX: REDUCTION MAMMAPLASTY: SUR839

## 2014-01-20 ENCOUNTER — Telehealth: Payer: Self-pay | Admitting: Family

## 2014-01-20 DIAGNOSIS — H109 Unspecified conjunctivitis: Secondary | ICD-10-CM

## 2014-01-20 MED ORDER — POLYMYXIN B-TRIMETHOPRIM 10000-0.1 UNIT/ML-% OP SOLN: 2.0000 [drp] | OPHTHALMIC | Status: DC

## 2014-01-20 NOTE — Progress Notes (Signed)
We are sorry that you are not feeling well.  Here is how we plan to help!   Based on what you have shared with me it looks like you have conjunctivitis.  Conjunctivitis is a common inflammatory or infectious condition of the eye that is often referred to as "pink eye".  In most cases it is contagious (viral or bacterial). However, not all conjunctivitis requires antibiotics (ex. Allergic).  We have made appropriate suggestions for you based upon your presentation.  I have prescribed Polytrim Ophthalmic drops 1-2 drops 4 times a day times 5 days  Pink eye can be highly contagious.  It is typically spread through direct contact with secretions, or contaminated objects or surfaces that one may have touched.  Strict handwashing is suggested with soap and water is urged.  If not available, use alcohol based had sanitizer.  Avoid unnecessary touching of the eye.  If you wear contact lenses, you will need to refrain from wearing them until you see no white discharge from the eye for at least 24 hours after being on medication.  You should see symptom improvement in 1-2 days after starting the medication regimen.  Call us if symptoms are not improved in 1-2 days.  Home Care:  Wash your hands often!  Do not wear your contacts until you complete your treatment plan.  Avoid sharing towels, bed linen, personal items with a person who has pink eye.  See attention for anyone in your home with similar symptoms.  Get Help Right Away If:  Your symptoms do not improve.  You develop blurred or loss of vision.  Your symptoms worsen (increased discharge, pain or redness)  Your e-visit answers were reviewed by a board certified advanced clinical practitioner to complete your personal care plan.  Depending on the condition, your plan could have included both over the counter or prescription medications.  If there is a problem please reply  once you have received a response from your provider.  Your safety is  important to us.  If you have drug allergies check your prescription carefully.    You can use MyChart to ask questions about today's visit, request a non-urgent call back, or ask for a work or school excuse.  You will get an e-mail in the next two days asking about your experience.  I hope that your e-visit has been valuable and will speed your recovery. Thank you for using e-visits.      

## 2014-07-05 HISTORY — PX: BREAST REDUCTION SURGERY: SHX8

## 2014-09-30 ENCOUNTER — Encounter: Payer: Self-pay | Admitting: Internal Medicine

## 2014-10-16 ENCOUNTER — Other Ambulatory Visit: Payer: Self-pay | Admitting: Internal Medicine

## 2015-01-05 HISTORY — PX: BREAST REDUCTION SURGERY: SHX8

## 2015-11-14 ENCOUNTER — Encounter: Payer: Self-pay | Admitting: Physician Assistant

## 2015-11-14 ENCOUNTER — Ambulatory Visit: Payer: Self-pay | Admitting: Physician Assistant

## 2015-11-14 DIAGNOSIS — J029 Acute pharyngitis, unspecified: Secondary | ICD-10-CM

## 2015-11-14 MED ORDER — MAGIC MOUTHWASH W/LIDOCAINE: 5.0000 mL | Freq: Four times a day (QID) | ORAL | 0 refills | Status: DC

## 2015-11-14 MED ORDER — AZITHROMYCIN 250 MG PO TABS: ORAL_TABLET | ORAL | 0 refills | Status: DC

## 2015-11-14 NOTE — Progress Notes (Signed)
   Subjective:Sore throat    Patient ID: Sarah Hill, female    DOB: 07/09/1980, 35 y.o.   MRN: [id number]  HPI Patient c/o sore throat with left cervical node enlargement for 2 days. Denies other URI s/s. No palliative measure for compliant. Tonsils excised 3 years ago.   Review of Systems    Negative except for compliant. Objective:   Physical Exam HEENT unremarkable except for erythematous pharynx. Neck supple with left cervical adenopathy. Lungs CTA and Heart RRR.       Assessment & Plan:Pharyngitis  Magic mouthwash and Zithromax.  Follow up with PCP in 3 days if no improvement.

## 2016-01-15 DIAGNOSIS — Z01419 Encounter for gynecological examination (general) (routine) without abnormal findings: Secondary | ICD-10-CM | POA: Diagnosis not present

## 2016-01-15 DIAGNOSIS — Z0000 Encounter for general adult medical examination without abnormal findings: Secondary | ICD-10-CM | POA: Diagnosis not present

## 2016-02-25 ENCOUNTER — Ambulatory Visit: Payer: Self-pay | Admitting: Physician Assistant

## 2016-02-25 VITALS — BP 110/80 | HR 75 | Temp 98.4°F

## 2016-02-25 DIAGNOSIS — J028 Acute pharyngitis due to other specified organisms: Principal | ICD-10-CM

## 2016-02-25 DIAGNOSIS — B9789 Other viral agents as the cause of diseases classified elsewhere: Principal | ICD-10-CM

## 2016-02-25 DIAGNOSIS — J029 Acute pharyngitis, unspecified: Secondary | ICD-10-CM

## 2016-02-25 LAB — POCT RAPID STREP A (OFFICE): Rapid Strep A Screen: NEGATIVE

## 2016-02-25 NOTE — Progress Notes (Signed)
S: c/o sore throat, no fever/chills, some body aches and headache, sx started last night, was worse when she woke up this morning, no cough or congestion, no v/d  O: vitals wnl, nad, tms clear, throat red,more at uvula than posteriorly, neck supple, glands swollen and tender, lungs c t a, cv rrr, q strep neg  A: viral pharyngitis  P: reassurance, gargle with warm salt water

## 2017-01-19 ENCOUNTER — Ambulatory Visit (INDEPENDENT_AMBULATORY_CARE_PROVIDER_SITE_OTHER): Payer: 59 | Admitting: Advanced Practice Midwife

## 2017-01-19 ENCOUNTER — Other Ambulatory Visit: Payer: Self-pay

## 2017-01-19 ENCOUNTER — Encounter: Payer: Self-pay | Admitting: Advanced Practice Midwife

## 2017-01-19 VITALS — BP 106/72 | HR 55 | Ht 61.0 in | Wt 148.0 lb

## 2017-01-19 DIAGNOSIS — Z124 Encounter for screening for malignant neoplasm of cervix: Secondary | ICD-10-CM

## 2017-01-19 DIAGNOSIS — Z3041 Encounter for surveillance of contraceptive pills: Secondary | ICD-10-CM

## 2017-01-19 DIAGNOSIS — Z01419 Encounter for gynecological examination (general) (routine) without abnormal findings: Secondary | ICD-10-CM | POA: Diagnosis not present

## 2017-01-19 MED ORDER — DESOGESTREL-ETHINYL ESTRADIOL 0.15-0.02/0.01 MG (21/5) PO TABS: 1.0000 | ORAL_TABLET | Freq: Every day | ORAL | 11 refills | Status: DC

## 2017-01-19 NOTE — Progress Notes (Signed)
Patient ID: Sarah Hill, female   DOB: Mar 06, 1980, 37 y.o.   MRN: [id number]     Gynecology Annual Exam  PCP: Dianne DunAron, Talia M, MD  Chief Complaint:  Chief Complaint  Patient presents with  . Gynecologic Exam    No Complaints    History of Present Illness: Patient is a 37 y.o. G0P0000 presents for annual exam. The patient has no complaints today.   LMP: Patient's last menstrual period was 12/30/2016. Average Interval: regular, 28 days Duration of flow: 5 days Heavy Menses: no Clots: no Intermenstrual Bleeding: no Postcoital Bleeding: no Dysmenorrhea: no  The patient is sexually active. She currently uses OCP (estrogen/progesterone) for contraception. She denies dyspareunia.  The patient does perform self breast exams.  There is no notable family history of breast or ovarian cancer in her family.  The patient wears seatbelts: yes.   The patient has regular exercise: yes.    The patient denies current symptoms of depression.    Review of Systems: Review of Systems  Constitutional: Negative.   HENT: Negative.   Eyes: Negative.   Respiratory: Negative.   Cardiovascular: Negative.   Gastrointestinal: Negative.   Genitourinary: Negative.   Musculoskeletal: Negative.   Skin: Negative.   Neurological: Negative.   Endo/Heme/Allergies: Negative.   Psychiatric/Behavioral: Negative.     Past Medical History:  History reviewed. No pertinent past medical history.  Past Surgical History:  Past Surgical History:  Procedure Laterality Date  . BREAST REDUCTION SURGERY  07/2014  . TONSILLECTOMY      Gynecologic History:  Patient's last menstrual period was 12/30/2016. Contraception: OCP (estrogen/progesterone) Last Pap: Results were: no abnormalities   Obstetric History: G0P0000  Family History:  Family History  Problem Relation Age of Onset  . Hearing loss Maternal Grandmother   . Hypertension Mother   . Diabetes Mother   . Breast cancer Paternal Aunt [phone]  .  Breast cancer Paternal Aunt [phone]    Social History:  Social History   Socioeconomic History  . Marital status: Single    Spouse name: Not on file  . Number of children: Not on file  . Years of education: Not on file  . Highest education level: Not on file  Social Needs  . Financial resource strain: Not on file  . Food insecurity - worry: Not on file  . Food insecurity - inability: Not on file  . Transportation needs - medical: Not on file  . Transportation needs - non-medical: Not on file  Occupational History  . 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 Smoking status: Never Smoker  . Smokeless tobacco: Never Used  Substance and Sexual Activity  . Alcohol use: Yes    Alcohol/week: 0.6 oz    Types: 1 Glasses of wine per week  . Drug use: No  . Sexual activity: Not on file  Other Topics Concern  . Not on file  Social History Narrative   Lives [place] in [place].  Wonderful relationship with mother Terri SkainsDenise Warbington    Allergies:  Allergies  Allergen Reactions  . Amoxicillin-Pot Clavulanate     Medications: Prior to Admission medications   Medication Sig Start Date End Date Taking? Authorizing Provider  desogestrel-ethinyl estradiol (KARIVA) 0.15-0.02/0.01 MG (21/5) tablet Take 1 tablet by mouth daily. 01/19/17   Tresea MallGledhill, Deneshia Zucker, CNM  Multiple Vitamin (MULTIVITAMIN) tablet Take 1 tablet by mouth daily.      [provider]  Omega-3 Fatty Acids (FISH  OIL) 1000 MG CAPS Take 1 capsule by mouth as needed.     [provider]    Physical Exam Vitals: Blood pressure 106/72, pulse (!) 55, height 5\' 1"  (1.549 m), weight 148 lb (67.1 kg), last menstrual period 12/30/2016.  General: NAD HEENT: normocephalic, anicteric Thyroid: no enlargement, no palpable nodules Pulmonary: No increased work of breathing, CTAB Cardiovascular: RRR, distal pulses 2+ Breast: Breast symmetrical, no tenderness, no palpable  nodules or masses, no skin or nipple retraction present, no nipple discharge.  No axillary or supraclavicular lymphadenopathy. Abdomen: NABS, soft, non-tender, non-distended.  Umbilicus without lesions.  No hepatomegaly, splenomegaly or masses palpable. No evidence of hernia  Genitourinary:  External: Normal external female genitalia.  Normal urethral meatus, normal  Bartholin's and Skene's glands.    Vagina: Normal vaginal mucosa, no evidence of prolapse.    Cervix: Grossly normal in appearance, no bleeding, no CMT  Uterus: Non-enlarged, mobile, normal contour.    Adnexa: ovaries non-enlarged, no adnexal masses  Rectal: deferred  Lymphatic: no evidence of inguinal lymphadenopathy Extremities: no edema, erythema, or tenderness Neurologic: Grossly intact Psychiatric: mood appropriate, affect full   Assessment: 37 y.o. G0P0000 routine annual exam  Plan: Problem List Items Addressed This Visit    None    Visit Diagnoses    Well woman exam with routine gynecological exam    -  Primary   Relevant Orders   IGP, Aptima HPV   Cervical cancer screening       Relevant Orders   IGP, Aptima HPV   Encounter for surveillance of contraceptive pills       Relevant Medications   desogestrel-ethinyl estradiol (KARIVA) 0.15-0.02/0.01 MG (21/5) tablet      1) STI screening was offered and declined  2) ASCCP guidelines and rational discussed.  Patient opts for every 3 years screening interval  3) Contraception - continue with current OCP  4) Routine healthcare maintenance including cholesterol, diabetes screening discussed managed by PCP  5) Follow up 1 year for routine annual exam  Tresea Mall, CNM

## 2017-01-21 LAB — IGP, APTIMA HPV
HPV Aptima: POSITIVE — AB
PAP Smear Comment: 0

## 2017-04-19 DIAGNOSIS — H5213 Myopia, bilateral: Secondary | ICD-10-CM | POA: Diagnosis not present

## 2017-07-12 DIAGNOSIS — M1611 Unilateral primary osteoarthritis, right hip: Secondary | ICD-10-CM | POA: Diagnosis not present

## 2017-07-12 DIAGNOSIS — M7061 Trochanteric bursitis, right hip: Secondary | ICD-10-CM | POA: Diagnosis not present

## 2017-07-12 DIAGNOSIS — M25551 Pain in right hip: Secondary | ICD-10-CM | POA: Diagnosis not present

## 2017-07-20 ENCOUNTER — Ambulatory Visit: Payer: 59 | Admitting: Physical Therapy

## 2017-07-26 DIAGNOSIS — M7061 Trochanteric bursitis, right hip: Secondary | ICD-10-CM | POA: Diagnosis not present

## 2017-07-27 ENCOUNTER — Ambulatory Visit: Payer: 59 | Admitting: Physical Therapy

## 2017-08-02 ENCOUNTER — Ambulatory Visit: Payer: 59 | Admitting: Physical Therapy

## 2017-08-09 ENCOUNTER — Encounter: Payer: Self-pay | Admitting: Physical Therapy

## 2017-08-11 ENCOUNTER — Encounter: Payer: Self-pay | Admitting: Physical Therapy

## 2017-08-15 ENCOUNTER — Encounter: Payer: Self-pay | Admitting: Physical Therapy

## 2017-08-17 ENCOUNTER — Encounter: Payer: Self-pay | Admitting: Physical Therapy

## 2017-11-07 ENCOUNTER — Telehealth: Payer: Self-pay

## 2017-11-07 NOTE — Telephone Encounter (Signed)
Copied from CRM [id number]. Topic: Appointment Scheduling - New Patient >> Nov 07, 2017 11:15 AM Luanna Cole wrote: New patient has been scheduled for your office. Provider: Jen Mow Date of Appointment: 01/05/18  Route to department's PEC pool.

## 2017-11-07 NOTE — Telephone Encounter (Signed)
Mailed npp

## 2018-01-05 ENCOUNTER — Ambulatory Visit: Payer: Self-pay | Admitting: Family Medicine

## 2018-01-30 ENCOUNTER — Encounter: Payer: Self-pay | Admitting: Family Medicine

## 2018-01-30 ENCOUNTER — Ambulatory Visit (INDEPENDENT_AMBULATORY_CARE_PROVIDER_SITE_OTHER): Payer: No Typology Code available for payment source | Admitting: Family Medicine

## 2018-01-30 VITALS — BP 118/82 | HR 73 | Temp 98.3°F | Resp 16 | Ht 60.5 in | Wt 147.0 lb

## 2018-01-30 DIAGNOSIS — Z0000 Encounter for general adult medical examination without abnormal findings: Secondary | ICD-10-CM | POA: Diagnosis not present

## 2018-01-30 DIAGNOSIS — Z3041 Encounter for surveillance of contraceptive pills: Secondary | ICD-10-CM | POA: Diagnosis not present

## 2018-01-30 MED ORDER — DESOGESTREL-ETHINYL ESTRADIOL 0.15-0.02/0.01 MG (21/5) PO TABS: 1.0000 | ORAL_TABLET | Freq: Every day | ORAL | 3 refills | Status: DC

## 2018-01-30 NOTE — Progress Notes (Signed)
Subjective:    Patient ID: Oneida Arenas, female    DOB: 1980-07-19, 38 y.o.   MRN: [id number]  HPI   Patient presents to clinic to establish primary care and for annual wellness visit.  She denies any issues today.  Does request a refill on birth control pills.  She has taken [place] birth control tablets for the past 7 to 8 years without any issues.  Gets a regular cycle each month that last between 3 to 4 days.  Denies any issues with excessive cramping or pain.  Patient is a non-smoker.  She sees the eye doctor every 1 to 2 years, sees dentist twice per year.  She did have a mammogram approximately 2 to 3 years ago, everything was negative.  Patient does have a family history of breast cancer and paternal aunt, also has had breast reduction surgery which has caused scar tissue in the breast.  Patient has been reassured by surgeon that scar tissue is related to the surgery is often to be worried about.  Patient does do self breast exams as well and is vigilant to monitor for any changes.  Patient's vaccines are up-to-date.  Patient's Pap smear was last done in 2019, results were negative.  Next due in 2022.  Patient Active Problem List   Diagnosis Date Noted  . Cold intolerance 12/03/2010  . Weight gain 07/21/2010  . FATIGUE 09/27/2008   Social History   Tobacco Use  . Smoking status: Never Smoker  . Smokeless tobacco: Never Used  Substance Use Topics  . Alcohol use: Yes    Alcohol/week: 1.0 standard drinks    Types: 1 Glasses of wine per week   Past Surgical History:  Procedure Laterality Date  . BREAST REDUCTION SURGERY  07/2014  . TONSILLECTOMY     Family History  Problem Relation Age of Onset  . Hearing loss Maternal Grandmother   . Hypertension Mother   . Diabetes Mother   . Hearing loss Mother   . Breast cancer Paternal Aunt 87  . Breast cancer Paternal Aunt 50   Review of Systems  Constitutional: Negative for chills, fatigue and fever.  HENT:  Negative for congestion, ear pain, sinus pain and sore throat.   Eyes: Negative.   Respiratory: Negative for cough, shortness of breath and wheezing.   Cardiovascular: Negative for chest pain, palpitations and leg swelling.  Gastrointestinal: Negative for abdominal pain, diarrhea, nausea and vomiting.  Genitourinary: Negative for dysuria, frequency and urgency.  Musculoskeletal: Negative for arthralgias and myalgias.  Skin: Negative for color change, pallor and rash.  Neurological: Negative for syncope, light-headedness and headaches.  Psychiatric/Behavioral: The patient is not nervous/anxious.       Objective:   Physical Exam  Constitutional: She appears well-developed and well-nourished. No distress.  HENT:  Head: Normocephalic and atraumatic.  Eyes: Pupils are equal, round, and reactive to light. EOM are normal. No scleral icterus.  Neck: Normal range of motion. Neck supple. No tracheal deviation present.  Cardiovascular: Normal rate, regular rhythm and normal heart sounds.  Pulmonary/Chest: Effort normal and breath sounds normal. No respiratory distress. She has no wheezes. She has no rales.  Abdominal: Soft. Bowel sounds are normal. There is no tenderness.  Neurological: She is alert and oriented to person, place, and time.  Gait normal  Skin: Skin is warm and dry. No pallor.  Psychiatric: She has a normal mood and affect. Her behavior is normal. Thought content normal.   Nursing note and vitals reviewed.  Vitals:   01/30/18 0930  BP: 118/82  Pulse: 73  Resp: 16  Temp: 98.3 F (36.8 C)  SpO2: 98%      Assessment & Plan:    Well adult exam - overall patient is a healthy 38 year old female.  We will get screening lab work today including CBC, CMP, lipid panel, thyroid panel, vitamin D, vitamin B12/folate panel.  Discussed healthy diet including balance food groups, overall lower carbs/lower processed sugars and higher vegetables and higher lean proteins.  Discussed  regular physical activity and daily stretching.  Patient does wear seatbelt every time she drives.  Also discussed safe sun practices including use of SPF when outdoors and reapplication every 2 hours to avoid sunburn.   Patient will follow-up annually for wellness visit and is aware she can return to clinic anytime if issues arise.

## 2019-02-26 ENCOUNTER — Ambulatory Visit (INDEPENDENT_AMBULATORY_CARE_PROVIDER_SITE_OTHER): Payer: No Typology Code available for payment source | Admitting: Family

## 2019-02-26 ENCOUNTER — Encounter: Payer: Self-pay | Admitting: Family

## 2019-02-26 VITALS — BP 114/68 | HR 72 | Wt 131.0 lb

## 2019-02-26 DIAGNOSIS — Z7689 Persons encountering health services in other specified circumstances: Secondary | ICD-10-CM

## 2019-02-26 DIAGNOSIS — Z3041 Encounter for surveillance of contraceptive pills: Secondary | ICD-10-CM | POA: Diagnosis not present

## 2019-02-26 DIAGNOSIS — Z30011 Encounter for initial prescription of contraceptive pills: Secondary | ICD-10-CM

## 2019-02-26 DIAGNOSIS — Z0000 Encounter for general adult medical examination without abnormal findings: Secondary | ICD-10-CM | POA: Insufficient documentation

## 2019-02-26 MED ORDER — DESOGESTREL-ETHINYL ESTRADIOL 0.15-0.02/0.01 MG (21/5) PO TABS: 1.0000 | ORAL_TABLET | Freq: Every day | ORAL | 3 refills | Status: DC

## 2019-02-26 NOTE — Progress Notes (Addendum)
Virtual Visit via Video Note  I connected with@  on 02/28/19 at  2:30 PM EST by a video enabled telemedicine application and verified that I am speaking with the correct person using two identifiers.  Location patient: home Location provider:work  Persons participating in the virtual visit: patient, provider  I discussed the limitations of evaluation and management by telemedicine and the availability of in person appointments. The patient expressed understanding and agreed to proceed.   HPI: Establish care, transfer care  Feels well today, no complaints Would like to discuss birth control.  Needs a refill.  Has been on OCP for 20 years. Menses are normal, regular.  Started for prevention of pregnancy.   No history of DVT, migraine with aura. No history of cancer. Patient states she's not pregnant or breast-feeding.No concern for STDs.   Abnormal Pap, had HPV in 2019. Follows with Helmut Muster.  Due for mammogram, this was done at 33 years at [facility]  Not smoker.   ROS: See pertinent positives and negatives per HPI.  Past Medical History:  Diagnosis Date  . History of stomach ulcers 2011    Past Surgical History:  Procedure Laterality Date  . BREAST REDUCTION SURGERY  07/2014  . BREAST REDUCTION SURGERY  2017  . TONSILLECTOMY    . TONSILLECTOMY  2007    Family History  Problem Relation Age of Onset  . Hearing loss Maternal Grandmother   . Hypertension Mother   . Diabetes Mother   . Hearing loss Mother   . Breast cancer Paternal Aunt 75  . Breast cancer Paternal Aunt 24  . Clotting disorder Neg Hx     SOCIAL HX: never smoker   Current Outpatient Medications:  .  Cholecalciferol (VITAMIN D3) 50 MCG (2000 UT) capsule, Take 2,000 Units by mouth daily., Disp: , Rfl:  .  desogestrel-ethinyl estradiol (KARIVA) 0.15-0.02/0.01 MG (21/5) tablet, Take 1 tablet by mouth daily., Disp: 3 Package, Rfl: 3 .  Multiple Vitamin (MULTIVITAMIN) tablet, Take 1 tablet by mouth  daily.  , Disp: , Rfl:  .  Omega-3 Fatty Acids (FISH OIL) 1000 MG CAPS, Take 1 capsule by mouth as needed. , Disp: , Rfl:   EXAM:  VITALS per patient if applicable:  GENERAL: alert, oriented, appears well and in no acute distress  HEENT: atraumatic, conjunttiva clear, no obvious abnormalities on inspection of external nose and ears  NECK: normal movements of the head and neck  LUNGS: on inspection no signs of respiratory distress, breathing rate appears normal, no obvious gross SOB, gasping or wheezing  CV: no obvious cyanosis  MS: moves all visible extremities without noticeable abnormality  PSYCH/NEURO: pleasant and cooperative, no obvious depression or anxiety, speech and thought processing grossly intact  ASSESSMENT AND PLAN:  Discussed the following assessment and plan:  Encounter to establish care - Plan: CBC with Differential/Platelet, Comprehensive metabolic panel, Hemoglobin A1c, Lipid panel, TSH, VITAMIN D 25 Hydroxy (Vit-D Deficiency, Fractures)  Encounter for surveillance of contraceptive pills - Plan: desogestrel-ethinyl estradiol (KARIVA) 0.15-0.02/0.01 MG (21/5) tablet  OCP (oral contraceptive pills) initiation Problem List Items Addressed This Visit      Other   Encounter to establish care - Primary    Reviewed medical history with patient today.  Ordered screening labs      Relevant Orders   CBC with Differential/Platelet   Comprehensive metabolic panel   Hemoglobin A1c   Lipid panel   TSH   VITAMIN D 25 Hydroxy (Vit-D Deficiency, Fractures)   OCP (  oral contraceptive pills) initiation    Stable, no contraindications to birth control at this time.  Have refilled medication.  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  She is spoken with her GYN provider, Elmo Putt Copland, in regards to this and will follow with Elmo Putt in regards to surveillance of cervical cancer.  Discussed the risk of CVA, DVT on combine birth control.  Advised as patient ages, we will need  to discuss alternatives.Of note, patient will [facility] and I have encouraged her to do this.        Other Visit Diagnoses    Encounter for surveillance of contraceptive pills       Relevant Medications   desogestrel-ethinyl estradiol (KARIVA) 0.15-0.02/0.01 MG (21/5) tablet      -we discussed possible serious and likely etiologies, options for evaluation and workup, limitations of telemedicine visit vs in person visit, treatment, treatment risks and precautions. Pt prefers to treat via telemedicine empirically rather then risking or undertaking an in person visit at this moment. Patient agrees to seek prompt in person care if worsening, new symptoms arise, or if is not improving with treatment.   I discussed the assessment and treatment plan with the patient. The patient was provided an opportunity to ask questions and all were answered. The patient agreed with the plan and demonstrated an understanding of the instructions.   The patient was advised to call back or seek an in-person evaluation if the symptoms worsen or if the condition fails to improve as anticipated.   Mable Paris, FNP

## 2019-02-26 NOTE — Assessment & Plan Note (Signed)
Reviewed medical history with patient today.  Ordered screening labs

## 2019-02-26 NOTE — Assessment & Plan Note (Signed)
Stable, no contraindications to birth control at this time.  Have refilled medication.  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  She is spoken with her GYN provider, Helmut Muster Copland, in regards to this and will follow with Helmut Muster in regards to surveillance of cervical cancer.  Discussed the risk of CVA, DVT on combine birth control.  Advised as patient ages, we will need to discuss alternatives.Of note, patient will schedule [facility] and I have encouraged her to do this.

## 2019-03-09 ENCOUNTER — Other Ambulatory Visit: Payer: No Typology Code available for payment source

## 2019-03-14 ENCOUNTER — Encounter: Payer: Self-pay | Admitting: Family

## 2019-03-16 ENCOUNTER — Other Ambulatory Visit: Payer: Self-pay | Admitting: Family

## 2019-03-16 DIAGNOSIS — Z1231 Encounter for screening mammogram for malignant neoplasm of breast: Secondary | ICD-10-CM

## 2019-03-19 ENCOUNTER — Other Ambulatory Visit: Payer: Self-pay

## 2019-03-19 ENCOUNTER — Encounter: Payer: Self-pay | Admitting: Family

## 2019-03-19 ENCOUNTER — Other Ambulatory Visit (INDEPENDENT_AMBULATORY_CARE_PROVIDER_SITE_OTHER): Payer: No Typology Code available for payment source

## 2019-03-19 DIAGNOSIS — Z7689 Persons encountering health services in other specified circumstances: Secondary | ICD-10-CM

## 2019-03-19 LAB — CBC WITH DIFFERENTIAL/PLATELET
Basophils Absolute: 0.1 10*3/uL (ref 0.0–0.1)
Basophils Relative: 0.7 % (ref 0.0–3.0)
Eosinophils Absolute: 0.2 10*3/uL (ref 0.0–0.7)
Eosinophils Relative: 1.8 % (ref 0.0–5.0)
HCT: 42.5 % (ref 36.0–46.0)
Hemoglobin: 14.5 g/dL (ref 12.0–15.0)
Lymphocytes Relative: 26.7 % (ref 12.0–46.0)
Lymphs Abs: 3 10*3/uL (ref 0.7–4.0)
MCHC: 34.1 g/dL (ref 30.0–36.0)
MCV: 89.6 fl (ref 78.0–100.0)
Monocytes Absolute: 0.5 10*3/uL (ref 0.1–1.0)
Monocytes Relative: 4.6 % (ref 3.0–12.0)
Neutro Abs: 7.4 10*3/uL (ref 1.4–7.7)
Neutrophils Relative %: 66.2 % (ref 43.0–77.0)
Platelets: 303 10*3/uL (ref 150.0–400.0)
RBC: 4.74 Mil/uL (ref 3.87–5.11)
RDW: 13.4 % (ref 11.5–15.5)
WBC: 11.1 10*3/uL — ABNORMAL HIGH (ref 4.0–10.5)

## 2019-03-19 LAB — COMPREHENSIVE METABOLIC PANEL
ALT: 20 U/L (ref 0–35)
AST: 18 U/L (ref 0–37)
Albumin: 4.1 g/dL (ref 3.5–5.2)
Alkaline Phosphatase: 65 U/L (ref 39–117)
BUN: 19 mg/dL (ref 6–23)
CO2: 23 mEq/L (ref 19–32)
Calcium: 9.4 mg/dL (ref 8.4–10.5)
Chloride: 103 mEq/L (ref 96–112)
Creatinine, Ser: 1.26 mg/dL — ABNORMAL HIGH (ref 0.40–1.20)
GFR: 46.96 mL/min — ABNORMAL LOW (ref >60.00)
Glucose, Bld: 85 mg/dL (ref 70–99)
Potassium: 4.1 mEq/L (ref 3.5–5.1)
Sodium: 138 mEq/L (ref 135–145)
Total Bilirubin: 0.5 mg/dL (ref 0.2–1.2)
Total Protein: 7.6 g/dL (ref 6.0–8.3)

## 2019-03-19 LAB — LIPID PANEL
Cholesterol: 171 mg/dL (ref 0–200)
HDL: 71.4 mg/dL (ref >39.00)
LDL Cholesterol: 59 mg/dL (ref 0–99)
NonHDL: 99.47
Total CHOL/HDL Ratio: 2
Triglycerides: 200 mg/dL — ABNORMAL HIGH (ref 0.0–149.0)
VLDL: 40 mg/dL (ref 0.0–40.0)

## 2019-03-19 LAB — TSH: TSH: 0.96 u[IU]/mL (ref 0.35–4.50)

## 2019-03-19 LAB — HEMOGLOBIN A1C: Hgb A1c MFr Bld: 5.2 % (ref 4.6–6.5)

## 2019-03-19 LAB — VITAMIN D 25 HYDROXY (VIT D DEFICIENCY, FRACTURES): VITD: 71.15 ng/mL (ref 30.00–100.00)

## 2019-03-20 ENCOUNTER — Other Ambulatory Visit: Payer: Self-pay | Admitting: Family

## 2019-03-20 DIAGNOSIS — R899 Unspecified abnormal finding in specimens from other organs, systems and tissues: Secondary | ICD-10-CM

## 2019-04-20 ENCOUNTER — Other Ambulatory Visit: Payer: No Typology Code available for payment source

## 2019-05-04 ENCOUNTER — Encounter: Payer: Self-pay | Admitting: Family

## 2019-05-09 ENCOUNTER — Other Ambulatory Visit: Payer: No Typology Code available for payment source

## 2019-09-06 ENCOUNTER — Ambulatory Visit
Admission: EM | Admit: 2019-09-06 | Discharge: 2019-09-06 | Disposition: A | Discharge status: Home / Self Care | Payer: No Typology Code available for payment source

## 2019-09-06 ENCOUNTER — Other Ambulatory Visit: Payer: Self-pay

## 2019-09-06 ENCOUNTER — Ambulatory Visit
Admission: RE | Admit: 2019-09-06 | Discharge: 2019-09-06 | Disposition: A | Discharge status: Home / Self Care | Payer: No Typology Code available for payment source | Source: Ambulatory Visit | Attending: Emergency Medicine | Admitting: Emergency Medicine

## 2019-09-06 ENCOUNTER — Other Ambulatory Visit: Payer: Self-pay | Admitting: Emergency Medicine

## 2019-09-06 ENCOUNTER — Encounter: Payer: Self-pay | Admitting: Family

## 2019-09-06 ENCOUNTER — Ambulatory Visit
Admission: RE | Admit: 2019-09-06 | Discharge: 2019-09-06 | Disposition: A | Discharge status: Home / Self Care | Payer: No Typology Code available for payment source | Attending: Emergency Medicine | Admitting: Emergency Medicine

## 2019-09-06 ENCOUNTER — Telehealth: Payer: Self-pay | Admitting: Family

## 2019-09-06 DIAGNOSIS — M79672 Pain in left foot: Secondary | ICD-10-CM

## 2019-09-06 NOTE — Telephone Encounter (Signed)
Patient was seen at [facility] [place] today would you be willing to send referral?

## 2019-09-06 NOTE — ED Provider Notes (Signed)
Sarah Hill    CSN: [id number] Arrival date & time: 09/06/19  1034      History   Chief Complaint Chief Complaint  Patient presents with  . Foot Pain    HPI Sarah Hill is a 39 y.o. female.   Patient presents with left foot pain and swelling since this morning.  The pain is at the base of her third toe on the plantar surface.  She reports intermittent tingling of her third toe.  No falls or injury.  No fever, chills, wounds, redness, bruising, warmth, numbness, weakness, or other symptoms.  Treatment attempted at home with ice packs.  The history is provided by the patient.    Past Medical History:  Diagnosis Date  . History of stomach ulcers 2011    Patient Active Problem List   Diagnosis Date Noted  . Encounter to establish care 02/26/2019  . OCP (oral contraceptive pills) initiation 02/26/2019  . Weight gain 07/21/2010    Past Surgical History:  Procedure Laterality Date  . BREAST REDUCTION SURGERY  07/2014  . BREAST REDUCTION SURGERY  2017  . TONSILLECTOMY    . TONSILLECTOMY  2007    OB History    Gravida  0   Para  0   Term  0   Preterm  0   AB  0   Living  0     SAB  0   TAB  0   Ectopic  0   Multiple  0   Live Births  0            Home Medications    Prior to Admission medications   Medication Sig Start Date End Date Taking? Authorizing Provider  Cholecalciferol (VITAMIN D3) 50 MCG (2000 UT) capsule Take 2,000 Units by mouth daily.   Yes [provider]  desogestrel-ethinyl estradiol (KARIVA) 0.15-0.02/0.01 MG (21/5) tablet Take 1 tablet by mouth daily. 02/26/19  Yes Allegra Grana, FNP  Multiple Vitamin (MULTIVITAMIN) tablet Take 1 tablet by mouth daily.     Yes [provider]  Omega-3 Fatty Acids (FISH OIL) 1000 MG CAPS Take 1 capsule by mouth as needed.    Yes [provider]    Family History Family History  Problem Relation Age of Onset  . Hearing loss Maternal Grandmother   .  Hypertension Mother   . Diabetes Mother   . Hearing loss Mother   . Breast cancer Paternal Aunt 67  . Breast cancer Paternal Aunt 61  . Clotting disorder Neg Hx     Social History Social History   Tobacco Use  . Smoking status: Never Smoker  . Smokeless tobacco: Never Used  Vaping Use  . Vaping Use: Never used  Substance Use Topics  . Alcohol use: Yes    Alcohol/week: 1.0 standard drink    Types: 1 Glasses of wine per week  . Drug use: No     Allergies   Amoxicillin-pot clavulanate   Review of Systems Review of Systems  Constitutional: Negative for chills and fever.  HENT: Negative for ear pain and sore throat.   Eyes: Negative for pain and visual disturbance.  Respiratory: Negative for cough and shortness of breath.   Cardiovascular: Negative for chest pain and palpitations.  Gastrointestinal: Negative for abdominal pain and vomiting.  Genitourinary: Negative for dysuria and hematuria.  Musculoskeletal: Positive for arthralgias. Negative for back pain.  Skin: Negative for color change and rash.  Neurological: Negative for seizures, syncope, weakness and  numbness.  All other systems reviewed and are negative.    Physical Exam Triage Vital Signs ED Triage Vitals  Enc Vitals Group     BP      Pulse      Resp      Temp      Temp src      SpO2      Weight      Height      Head Circumference      Peak Flow      Pain Score      Pain Loc      Pain Edu?      Excl. in GC?    No data found.  Updated Vital Signs BP 130/78 (BP Location: Left Arm)   Pulse (!) 104   Temp 98.1 F (36.7 C) (Oral)   Resp 18   LMP 08/23/2019 (Exact Date)   SpO2 98%   Visual Acuity Right Eye Distance:   Left Eye Distance:   Bilateral Distance:    Right Eye Near:   Left Eye Near:    Bilateral Near:     Physical Exam Vitals and nursing note reviewed.  Constitutional:      General: She is not in acute distress.    Appearance: She is well-developed.  HENT:     Head:  Normocephalic and atraumatic.     Mouth/Throat:     Mouth: Mucous membranes are moist.  Eyes:     Conjunctiva/sclera: Conjunctivae normal.  Cardiovascular:     Rate and Rhythm: Normal rate and regular rhythm.     Heart sounds: No murmur heard.   Pulmonary:     Effort: Pulmonary effort is normal. No respiratory distress.     Breath sounds: Normal breath sounds.  Abdominal:     Palpations: Abdomen is soft.     Tenderness: There is no abdominal tenderness.  Musculoskeletal:        General: Swelling and tenderness present. No deformity or signs of injury. Normal range of motion.     Cervical back: Neck supple.       Feet:     Comments: Mild edema and tenderness at plantar surface base of 3rd toe. No wounds, erythema, ecchymosis, warmth.  Skin:    General: Skin is warm and dry.     Capillary Refill: Capillary refill takes less than 2 seconds.     Findings: No bruising, erythema, lesion or rash.  Neurological:     General: No focal deficit present.     Mental Status: She is alert and oriented to person, place, and time.     Sensory: No sensory deficit.     Motor: No weakness.     Gait: Gait abnormal.  Psychiatric:        Mood and Affect: Mood normal.        Behavior: Behavior normal.      UC Treatments / Results  Labs (all labs ordered are listed, but only abnormal results are displayed) Labs Reviewed - No data to display  EKG   Radiology DG Foot Complete Left  Result Date: 09/06/2019 CLINICAL DATA:  Sudden onset of pain. No known injury. Pain localizes to third toe and first MTP joint. EXAM: LEFT FOOT - COMPLETE 3+ VIEW COMPARISON:  None. FINDINGS: There is no evidence of fracture or dislocation. There is no evidence of arthropathy or other focal bone abnormality. Soft tissues are unremarkable. IMPRESSION: Negative. Electronically Signed   By: Signa Kell M.D.   On:  09/06/2019 11:59    Procedures Procedures (including critical care time)  Medications Ordered in  UC Medications - No data to display  Initial Impression / Assessment and Plan / UC Course  I have reviewed the triage vital signs and the nursing notes.  Pertinent labs & imaging results that were available during my care of the patient were reviewed by me and considered in my medical decision making (see chart for details).   Left foot pain.  X-ray negative.  Treating with rest, elevation, ice packs.  Patient is unable to take NSAIDs due to history of gastric ulcers.  Directed her to follow-up with her PCP or an orthopedist if her symptoms persist or worsen.  Patient agrees to plan of care.   Final Clinical Impressions(s) / UC Diagnoses   Final diagnoses:  Left foot pain     Discharge Instructions     Go to [facility] for your x-ray.  I will call you with the results this afternoon.  Rest and elevate your foot.  Apply ice packs 2-3 times a day for up to 20 minutes each.    Follow up with your primary care provider or an orthopedist if you symptoms continue or worsen.       ED Prescriptions    None     PDMP not reviewed this encounter.   Mickie Bail, NP 09/06/19 [phone]

## 2019-09-06 NOTE — Telephone Encounter (Signed)
Pt was seen at [facility] today and per her insurance she needs a referral sent to [facility]

## 2019-09-06 NOTE — ED Triage Notes (Signed)
Pt presents with sudden onset of L foot pain upon awakening.  Third toe tender with swelling noted at base of toe.  Feels like stepping on a rock when walking.  No redness noted. At rest pain is 2/10.  Increases to 8/10 with ambulation.

## 2019-09-06 NOTE — Discharge Instructions (Signed)
Go to [facility] for your x-ray.  I will call you with the results this afternoon.  Rest and elevate your foot.  Apply ice packs 2-3 times a day for up to 20 minutes each.    Follow up with your primary care provider or an orthopedist if you symptoms continue or worsen.

## 2019-09-07 ENCOUNTER — Ambulatory Visit (INDEPENDENT_AMBULATORY_CARE_PROVIDER_SITE_OTHER): Payer: No Typology Code available for payment source | Admitting: Podiatry

## 2019-09-07 DIAGNOSIS — M7752 Other enthesopathy of left foot: Secondary | ICD-10-CM

## 2019-09-07 NOTE — Addendum Note (Signed)
Addended by: Allegra Grana on: 09/07/2019 06:40 AM   Modules accepted: Orders

## 2019-09-07 NOTE — Telephone Encounter (Signed)
I called patient to let her know that referral was placed. She will call [facility] to try to get an appointment ASAP.

## 2019-09-07 NOTE — Telephone Encounter (Signed)
Call pt  Ref to [facility] placed Please ask pt to call them and sch an appt asap [phone]  If she wants to go the [facility] route and pain worsens over holiday weekend, ensure she is aware of their walk in clinic below  [facility] [address]  M-F 1-7:30pm  [phone]

## 2019-09-12 ENCOUNTER — Ambulatory Visit: Payer: Self-pay | Admitting: Podiatry

## 2019-09-12 ENCOUNTER — Encounter: Payer: Self-pay | Admitting: Podiatry

## 2019-09-12 NOTE — Progress Notes (Signed)
Subjective:  Patient ID: Sarah Hill, female    DOB: [birth year],  MRN: [id number]  Chief Complaint  Patient presents with  . Foot Pain    (NP)left foot pain - cant move toes-    39 y.o. female presents with the above complaint.  Patient presents with complaint of left third metatarsophalangeal joint pain.  Patient states that it has gotten worse over the past couple of days.  Patient had x-ray done yesterday which did not show any injury.  She denies any injury to the area.  Patient states she cannot move her toes.  She works at [employer].  She is constantly on her foot.  She would like to discuss treatment options.  She has not seen anyone else prior to seeing me for this.  Her pain scale is 7 out of 10 and is dull aching nature.   Review of Systems: Negative except as noted in the HPI. Denies N/V/F/Ch.  Past Medical History:  Diagnosis Date  . History of stomach ulcers 2011    Current Outpatient Medications:  .  Cholecalciferol (VITAMIN D3) 50 MCG (2000 UT) capsule, Take 2,000 Units by mouth daily., Disp: , Rfl:  .  desogestrel-ethinyl estradiol (KARIVA) 0.15-0.02/0.01 MG (21/5) tablet, Take 1 tablet by mouth daily., Disp: 3 Package, Rfl: 3 .  ivermectin (STROMECTOL) 3 MG TABS tablet, Take by mouth., Disp: , Rfl:  .  Multiple Vitamin (MULTIVITAMIN) tablet, Take 1 tablet by mouth daily.  , Disp: , Rfl:  .  Omega-3 Fatty Acids (FISH OIL) 1000 MG CAPS, Take 1 capsule by mouth as needed. , Disp: , Rfl:  .  phentermine (ADIPEX-P) 37.5 MG tablet, Take 37.5 mg by mouth daily., Disp: , Rfl:   Social History   Tobacco Use  Smoking Status Never Smoker  Smokeless Tobacco Never Used    Allergies  Allergen Reactions  . Amoxicillin-Pot Clavulanate    Objective:  There were no vitals filed for this visit. There is no height or weight on file to calculate BMI. Constitutional Well developed. Well nourished.  Vascular Dorsalis pedis pulses palpable bilaterally. Posterior  tibial pulses palpable bilaterally. Capillary refill normal to all digits.  No cyanosis or clubbing noted. Pedal hair growth normal.  Neurologic Normal speech. Oriented to person, place, and time. Epicritic sensation to light touch grossly present bilaterally.  Dermatologic Nails well groomed and normal in appearance. No open wounds. No skin lesions.  Orthopedic:  Pain on palpation to the left third metatarsophalangeal joint with range of motion and intra-articular pain to the third metatarsophalangeal joint.  No pain at the rest of the MPJ joints as well as IPJ joints.  No extensor or flexor tendinitis appreciated.  No Mulder's click or neuroma is appreciated to any of the interspaces.   Radiographs: 3 views of skeletally mature adult left foot: There is no evidence of fracture or dislocation. There is no evidence of arthropathy or other focal bone abnormality. Soft tissues are unremarkable. Assessment:   1. Capsulitis of metatarsophalangeal (MTP) joint of left foot    Plan:  Patient was evaluated and treated and all questions answered.  Left third metatarsophalangeal joint capsulitis -I explained patient the etiology of capsulitis and various treatment options were extensively discussed.  Given the amount of pain that she is having I believe she will benefit from a steroid injection as well as cam boot immobilization for next 4 weeks.  Patient agrees with the plan would like to proceed with a steroid injection. -A steroid injection  was performed at left third metatarsophalangeal joint using 1% plain Lidocaine and 10 mg of Kenalog. This was well tolerated. -Cam boot was dispensed. -If there is any improvement I will discuss orthotics management to prevent future recurrences.   No follow-ups on file.

## 2019-10-02 ENCOUNTER — Ambulatory Visit: Payer: No Typology Code available for payment source | Admitting: Podiatry

## 2019-10-26 ENCOUNTER — Ambulatory Visit
Admission: RE | Admit: 2019-10-26 | Discharge: 2019-10-26 | Disposition: A | Discharge status: Home / Self Care | Payer: No Typology Code available for payment source | Source: Ambulatory Visit | Attending: Family | Admitting: Family

## 2019-10-26 ENCOUNTER — Other Ambulatory Visit: Payer: Self-pay | Admitting: Family

## 2019-10-26 ENCOUNTER — Other Ambulatory Visit: Payer: Self-pay

## 2019-10-26 DIAGNOSIS — Z1231 Encounter for screening mammogram for malignant neoplasm of breast: Secondary | ICD-10-CM | POA: Diagnosis not present

## 2020-06-16 ENCOUNTER — Other Ambulatory Visit: Payer: Self-pay

## 2020-06-16 ENCOUNTER — Encounter: Payer: Self-pay | Admitting: Family

## 2020-06-16 ENCOUNTER — Ambulatory Visit (INDEPENDENT_AMBULATORY_CARE_PROVIDER_SITE_OTHER): Payer: 59 | Admitting: Family

## 2020-06-16 VITALS — BP 108/76 | HR 90 | Temp 97.8°F | Ht 60.5 in | Wt 152.0 lb

## 2020-06-16 DIAGNOSIS — Z0000 Encounter for general adult medical examination without abnormal findings: Secondary | ICD-10-CM | POA: Diagnosis not present

## 2020-06-16 DIAGNOSIS — R590 Localized enlarged lymph nodes: Secondary | ICD-10-CM | POA: Diagnosis not present

## 2020-06-16 NOTE — Addendum Note (Signed)
Addended by: Warden Fillers on: 06/16/2020 04:30 PM   Modules accepted: Orders

## 2020-06-16 NOTE — Progress Notes (Signed)
Subjective:    Patient ID: Sarah Hill, female    DOB: 1980-09-24, 40 y.o.   MRN: [id number]  CC: ROSANNA BICKLE is a 40 y.o. female who presents today for physical exam.    HPI: Feels well today No complaints Work is going well Upcoming vacation  She reports enlarged left side of neck 'lymph node' since she was a child and saw her pediatrician.    Colorectal Cancer Screening: No early family history Breast Cancer Screening: Mammogram UTD Cervical Cancer Screening: Pap 3 years ago showed HPV, negative malignancy with [facility] at [facility].         Hepatitis C screening - Candidate for, declines HIV Screening- Candidate for declines Labs: Screening labs at work.  Exercise: Gets regular exercise walking daily, and also incorporating weight lifting.   Alcohol use: rare  Smoking/tobacco use: Nonsmoker.     HISTORY:  Past Medical History:  Diagnosis Date   History of stomach ulcers 2011    Past Surgical History:  Procedure Laterality Date   BREAST REDUCTION SURGERY  07/2014   BREAST REDUCTION SURGERY  2017   REDUCTION MAMMAPLASTY Bilateral 2016   TONSILLECTOMY     TONSILLECTOMY  2007   Family History  Problem Relation Age of Onset   Hearing loss Maternal Grandmother    Hypertension Mother    Diabetes Mother    Hearing loss Mother    Breast cancer Paternal Aunt 31   Breast cancer Paternal Aunt 36   Clotting disorder Neg Hx       ALLERGIES: Amoxicillin-pot clavulanate  No current outpatient medications on file prior to visit.   No current facility-administered medications on file prior to visit.    Social History   Tobacco Use   Smoking status: Never   Smokeless tobacco: Never  Vaping Use   Vaping Use: Never used  Substance Use Topics   Alcohol use: Yes    Alcohol/week: 1.0 standard drink    Types: 1 Glasses of wine per week   Drug use: No    Review of Systems  Constitutional:  Negative for chills, fever and unexpected weight change.  HENT:   Negative for congestion.   Respiratory:  Negative for cough.   Cardiovascular:  Negative for chest pain, palpitations and leg swelling.  Gastrointestinal:  Negative for nausea and vomiting.  Genitourinary:  Negative for pelvic pain.  Musculoskeletal:  Negative for arthralgias and myalgias.  Skin:  Negative for rash.  Neurological:  Negative for headaches.  Hematological:  Negative for adenopathy.  Psychiatric/Behavioral:  Negative for confusion.      Objective:    BP 108/76   Pulse 90   Temp 97.8 F (36.6 C) (Skin)   Ht 5' 0.5" (1.537 m)   Wt 152 lb (68.9 kg)   SpO2 98%   BMI 29.20 kg/m   BP Readings from Last 3 Encounters:  06/16/20 108/76  09/06/19 130/78  02/26/19 114/68   Wt Readings from Last 3 Encounters:  06/16/20 152 lb (68.9 kg)  02/26/19 131 lb (59.4 kg)  01/30/18 147 lb (66.7 kg)    Physical Exam Vitals reviewed.  Constitutional:      Appearance: Normal appearance. She is well-developed.  Eyes:     Conjunctiva/sclera: Conjunctivae normal.  Neck:     Thyroid: No thyroid mass or thyromegaly.  Cardiovascular:     Rate and Rhythm: Normal rate and regular rhythm.     Pulses: Normal pulses.     Heart sounds: Normal heart sounds.  Pulmonary:     Effort: Pulmonary effort is normal.     Breath sounds: Normal breath sounds. No wheezing, rhonchi or rales.  Abdominal:     General: Bowel sounds are normal. There is no distension.     Palpations: Abdomen is soft. Abdomen is not rigid. There is no fluid wave or mass.     Tenderness: There is no abdominal tenderness. There is no guarding or rebound.  Lymphadenopathy:     Head:     Right side of head: No submental, submandibular, tonsillar, preauricular, posterior auricular or occipital adenopathy.     Left side of head: No submental, submandibular, tonsillar, preauricular, posterior auricular or occipital adenopathy.     Cervical: Cervical adenopathy present.     Left cervical: Superficial cervical adenopathy  present.     Comments: Non tender, approx 2 cm well defined  , movable mass.   Skin:    General: Skin is warm and dry.  Neurological:     Mental Status: She is alert.  Psychiatric:        Speech: Speech normal.        Behavior: Behavior normal.        Thought Content: Thought content normal.       Assessment & Plan:   Problem List Items Addressed This Visit       Immune and Lymphatic   Cervical lymphadenopathy    Left superficial cervical suspected lymph node since childhood ; I  advised baseline [place] and she declines today. She will let me know if were to enlarge or become tender.          Other   Routine physical examination - Primary    Patient declines CBE or pelvic exam as she will follow up with [facility] is aware her pap smear is abnormal and due. She will call to arrange follow up.  Encouraged continued exercise.  Note: We discussed relevant and past history of abnormal labs today at length, we decided to order appropriate labs for screening today based on this discussion. Patient was comfortable with this. Patient was advised if any symptoms were to change after today's visit, to notify me as we may always order additional labs.        Relevant Orders   Comprehensive metabolic panel   CBC with Differential/Platelet     I have discontinued Quita Skye. Hendrickson's Fish Oil, multivitamin, Vitamin D3, desogestrel-ethinyl estradiol, ivermectin, and phentermine.   No orders of the defined types were placed in this encounter.   Return precautions given.   Risks, benefits, and alternatives of the medications and treatment plan prescribed today were discussed, and patient expressed understanding.   Education regarding symptom management and diagnosis given to patient on AVS.   Continue to follow with Allegra Grana, FNP for routine health maintenance.   Sarah Hill and I agreed with plan.   Rennie Plowman, FNP

## 2020-06-16 NOTE — Assessment & Plan Note (Signed)
Patient declines CBE or pelvic exam as she will follow up with [facility] is aware her pap smear is abnormal and due. She will call to arrange follow up.  Encouraged continued exercise.  Note: We discussed relevant and past history of abnormal labs today at length, we decided to order appropriate labs for screening today based on this discussion. Patient was comfortable with this. Patient was advised if any symptoms were to change after today's visit, to notify me as we may always order additional labs.

## 2020-06-16 NOTE — Patient Instructions (Signed)
Nice to see you  Health Maintenance, Female Adopting a healthy lifestyle and getting preventive care are important in promoting health and wellness. Ask your health care provider about: The right schedule for you to have regular tests and exams. Things you can do on your own to prevent diseases and keep yourself healthy. What should I know about diet, weight, and exercise? Eat a healthy diet  Eat a diet that includes plenty of vegetables, fruits, low-fat dairy products, and lean protein. Do not eat a lot of foods that are high in solid fats, added sugars, or sodium.  Maintain a healthy weight Body mass index (BMI) is used to identify weight problems. It estimates body fat based on height and weight. Your health care provider can help determineyour BMI and help you achieve or maintain a healthy weight. Get regular exercise Get regular exercise. This is one of the most important things you can do for your health. Most adults should: Exercise for at least 150 minutes each week. The exercise should increase your heart rate and make you sweat (moderate-intensity exercise). Do strengthening exercises at least twice a week. This is in addition to the moderate-intensity exercise. Spend less time sitting. Even light physical activity can be beneficial. Watch cholesterol and blood lipids Have your blood tested for lipids and cholesterol at 42 years of age, then havethis test every 5 years. Have your cholesterol levels checked more often if: Your lipid or cholesterol levels are high. You are older than 42 years of age. You are at high risk for heart disease. What should I know about cancer screening? Depending on your health history and family history, you may need to have cancer screening at various ages. This may include screening for: Breast cancer. Cervical cancer. Colorectal cancer. Skin cancer. Lung cancer. What should I know about heart disease, diabetes, and high blood pressure? Blood  pressure and heart disease High blood pressure causes heart disease and increases the risk of stroke. This is more likely to develop in people who have high blood pressure readings, are of African descent, or are overweight. Have your blood pressure checked: Every 3-5 years if you are 18-39 years of age. Every year if you are 40 years old or older. Diabetes Have regular diabetes screenings. This checks your fasting blood sugar level. Have the screening done: Once every three years after age 40 if you are at a normal weight and have a low risk for diabetes. More often and at a younger age if you are overweight or have a high risk for diabetes. What should I know about preventing infection? Hepatitis B If you have a higher risk for hepatitis B, you should be screened for this virus. Talk with your health care provider to find out if you are at risk forhepatitis B infection. Hepatitis C Testing is recommended for: Everyone born from 1945 through 1965. Anyone with known risk factors for hepatitis C. Sexually transmitted infections (STIs) Get screened for STIs, including gonorrhea and chlamydia, if: You are sexually active and are younger than 42 years of age. You are older than 42 years of age and your health care provider tells you that you are at risk for this type of infection. Your sexual activity has changed since you were last screened, and you are at increased risk for chlamydia or gonorrhea. Ask your health care provider if you are at risk. Ask your health care provider about whether you are at high risk for HIV. Your health care provider may recommend   a prescription medicine to help prevent HIV infection. If you choose to take medicine to prevent HIV, you should first get tested for HIV. You should then be tested every 3 months for as long as you are taking the medicine. Pregnancy If you are about to stop having your period (premenopausal) and you may become pregnant, seek counseling  before you get pregnant. Take 400 to 800 micrograms (mcg) of folic acid every day if you become pregnant. Ask for birth control (contraception) if you want to prevent pregnancy. Osteoporosis and menopause Osteoporosis is a disease in which the bones lose minerals and strength with aging. This can result in bone fractures. If you are 65 years old or older, or if you are at risk for osteoporosis and fractures, ask your health care provider if you should: Be screened for bone loss. Take a calcium or vitamin D supplement to lower your risk of fractures. Be given hormone replacement therapy (HRT) to treat symptoms of menopause. Follow these instructions at home: Lifestyle Do not use any products that contain nicotine or tobacco, such as cigarettes, e-cigarettes, and chewing tobacco. If you need help quitting, ask your health care provider. Do not use street drugs. Do not share needles. Ask your health care provider for help if you need support or information about quitting drugs. Alcohol use Do not drink alcohol if: Your health care provider tells you not to drink. You are pregnant, may be pregnant, or are planning to become pregnant. If you drink alcohol: Limit how much you use to 0-1 drink a day. Limit intake if you are breastfeeding. Be aware of how much alcohol is in your drink. In the [place] one drink equals one 12 oz bottle of beer (355 mL), one 5 oz glass of wine (148 mL), or one 1 oz glass of hard liquor (44 mL). General instructions Schedule regular health, dental, and eye exams. Stay current with your vaccines. Tell your health care provider if: You often feel depressed. You have ever been abused or do not feel safe at home. Summary Adopting a healthy lifestyle and getting preventive care are important in promoting health and wellness. Follow your health care provider's instructions about healthy diet, exercising, and getting tested or screened for diseases. Follow your health  care provider's instructions on monitoring your cholesterol and blood pressure. This information is not intended to replace advice given to you by your health care provider. Make sure you discuss any questions you have with your healthcare provider. Document Revised: 12/14/2017 Document Reviewed: 12/14/2017 Elsevier Patient Education  2022 [employer]  

## 2020-06-16 NOTE — Assessment & Plan Note (Signed)
Left superficial cervical suspected lymph node since childhood ; I  advised baseline [place] and she declines today. She will let me know if were to enlarge or become tender.

## 2020-11-18 ENCOUNTER — Encounter: Payer: Self-pay | Admitting: Family

## 2020-11-18 ENCOUNTER — Other Ambulatory Visit: Payer: Self-pay

## 2020-11-18 ENCOUNTER — Telehealth (INDEPENDENT_AMBULATORY_CARE_PROVIDER_SITE_OTHER): Payer: 59 | Admitting: Family

## 2020-11-18 VITALS — BP 120/75 | Ht 60.0 in | Wt 158.0 lb

## 2020-11-18 DIAGNOSIS — R635 Abnormal weight gain: Secondary | ICD-10-CM | POA: Diagnosis not present

## 2020-11-18 MED ORDER — OZEMPIC (0.25 OR 0.5 MG/DOSE) 2 MG/1.5ML ~~LOC~~ SOPN
0.5000 mg | PEN_INJECTOR | SUBCUTANEOUS | 3 refills | Status: DC
  Filled 2020-11-18: qty 1.5, 28d supply, fill #0

## 2020-11-18 NOTE — Assessment & Plan Note (Signed)
Patient already started Ozempic through a friend.  She never took  the 0.25 mg dose.  She has been tolerating the 0.5 mg dose and her preference is to continue on this dose versus starting 0.25 mg as I typically recommend.  Counseled on black box warning as it relates to medullary thyroid cancer, multiple endocrine neoplasia.  I have sent prescription of Ozempic 0.5 mg.  Patient will let me know how she is doing.  She will continue low glycemic diet

## 2020-11-18 NOTE — Progress Notes (Signed)
Virtual Visit via Video Note  I connected with@  on 11/18/20 at 11:30 AM EST by a video enabled telemedicine application and verified that I am speaking with the correct person using two identifiers.  Location patient: home Location provider:work  Persons participating in the virtual visit: patient, provider  I discussed the limitations of evaluation and management by telemedicine and the availability of in person appointments. The patient expressed understanding and agreed to proceed.   HPI: Frustrated by weight gain over 2 years. She has started using a trainer and increased exercise.  She is interested in weight loss medication. She has friend that gave her 0.5mg  ozempic to use and she has proceeded to lost weight. She has used for 2 weeks. She has lost 5 lbs and she felt more full. No nausea, constipation.   She has tried phentermine without relief and complained of heart palpitations.  She is following a low carb diet  No mass in neck ,trouble or pain with swallowing.  No personal or family cancer thyroid cancer or MEN.     ROS: See pertinent positives and negatives per HPI.    EXAM:  VITALS per patient if applicable: BP 120/75   Ht 5' (1.524 m)   Wt 158 lb (71.7 kg)   BMI 30.86 kg/m  BP Readings from Last 3 Encounters:  11/18/20 120/75  06/16/20 108/76  09/06/19 130/78   Wt Readings from Last 3 Encounters:  11/18/20 158 lb (71.7 kg)  06/16/20 152 lb (68.9 kg)  02/26/19 131 lb (59.4 kg)    GENERAL: alert, oriented, appears well and in no acute distress  HEENT: atraumatic, conjunttiva clear, no obvious abnormalities on inspection of external nose and ears  NECK: normal movements of the head and neck  LUNGS: on inspection no signs of respiratory distress, breathing rate appears normal, no obvious gross SOB, gasping or wheezing  CV: no obvious cyanosis  MS: moves all visible extremities without noticeable abnormality  PSYCH/NEURO: pleasant and cooperative,  no obvious depression or anxiety, speech and thought processing grossly intact  ASSESSMENT AND PLAN:  Discussed the following assessment and plan:  Problem List Items Addressed This Visit       Other   Weight gain - Primary    Patient already started Ozempic through a friend.  She never took  the 0.25 mg dose.  She has been tolerating the 0.5 mg dose and her preference is to continue on this dose versus starting 0.25 mg as I typically recommend.  Counseled on black box warning as it relates to medullary thyroid cancer, multiple endocrine neoplasia.  I have sent prescription of Ozempic 0.5 mg.  Patient will let me know how she is doing.  She will continue low glycemic diet      Relevant Medications   Semaglutide,0.25 or 0.5MG /DOS, (OZEMPIC, 0.25 OR 0.5 MG/DOSE,) 2 MG/1.5ML SOPN   Other Relevant Orders   CBC with Differential/Platelet   Comprehensive metabolic panel   Lipid panel   Hemoglobin A1c   TSH   VITAMIN D 25 Hydroxy (Vit-D Deficiency, Fractures)    -we discussed possible serious and likely etiologies, options for evaluation and workup, limitations of telemedicine visit vs in person visit, treatment, treatment risks and precautions. Pt prefers to treat via telemedicine empirically rather then risking or undertaking an in person visit at this moment.  .   I discussed the assessment and treatment plan with the patient. The patient was provided an opportunity to ask questions and all were answered. The  patient agreed with the plan and demonstrated an understanding of the instructions.   The patient was advised to call back or seek an in-person evaluation if the symptoms worsen or if the condition fails to improve as anticipated.   Rennie Plowman, FNP

## 2020-11-18 NOTE — Patient Instructions (Addendum)
Since you  had been on the 0.5 mg of Ozempic, we will go ahead and continue this dose.   Conitnue ozempic 0.5mg  once per week once per week injected subcutaneously ( Sunflower)  in stomach. Please clean with alcohol swab prior to injection and be sure to rotate site. You may schedule a nurse visit if you would like to first injection.   After 4 weeks, and if tolerated and weight loss has not reached 1-2 lbs per week, please increase to 1mg  once per week  and you may the office for new prescription.    Please read information on medication below and remember black box warning that you may not take if you or a family member is diagnosed with thyroid cancer (medullary thyroid cancer), or multiple endocrine neoplasia ( MEN).       Semaglutide injection solution What is this medicine? SEMAGLUTIDE (Sem a GLOO tide) is used to improve blood sugar control in adults with type 2 diabetes. This medicine may be used with other diabetes medicines. This drug may also reduce the risk of heart attack or stroke if you have type 2 diabetes and risk factors for heart disease. This medicine may be used for other purposes; ask your health care provider or pharmacist if you have questions. COMMON BRAND NAME(S): OZEMPIC What should I tell my health care provider before I take this medicine? They need to know if you have any of these conditions: endocrine tumors (MEN 2) or if someone in your family had these tumors eye disease, vision problems history of pancreatitis kidney disease stomach problems thyroid cancer or if someone in your family had thyroid cancer an unusual or allergic reaction to semaglutide, other medicines, foods, dyes, or preservatives pregnant or trying to get pregnant breast-feeding How should I use this medicine? This medicine is for injection under the skin of your upper leg (thigh), stomach area, or upper arm. It is given once every week (every 7 days). You will be taught how to prepare and  give this medicine. Use exactly as directed. Take your medicine at regular intervals. Do not take it more often than directed. If you use this medicine with insulin, you should inject this medicine and the insulin separately. Do not mix them together. Do not give the injections right next to each other. Change (rotate) injection sites with each injection. It is important that you put your used needles and syringes in a special sharps container. Do not put them in a trash can. If you do not have a sharps container, call your pharmacist or healthcare provider to get one. A special MedGuide will be given to you by the pharmacist with each prescription and refill. Be sure to read this information carefully each time. This drug comes with INSTRUCTIONS FOR USE. Ask your pharmacist for directions on how to use this drug. Read the information carefully. Talk to your pharmacist or health care provider if you have questions. Talk to your pediatrician regarding the use of this medicine in children. Special care may be needed. Overdosage: If you think you have taken too much of this medicine contact a poison control center or emergency room at once. NOTE: This medicine is only for you. Do not share this medicine with others. What if I miss a dose? If you miss a dose, take it as soon as you can within 5 days after the missed dose. Then take your next dose at your regular weekly time. If it has been longer than 5 days  after the missed dose, do not take the missed dose. Take the next dose at your regular time. Do not take double or extra doses. If you have questions about a missed dose, contact your health care provider for advice. What may interact with this medicine? other medicines for diabetes Many medications may cause changes in blood sugar, these include: alcohol containing beverages antiviral medicines for HIV or AIDS aspirin and aspirin-like drugs certain medicines for blood pressure, heart disease,  irregular heart beat chromium diuretics female hormones, such as estrogens or progestins, birth control pills fenofibrate gemfibrozil isoniazid lanreotide female hormones or anabolic steroids MAOIs like Carbex, Eldepryl, Marplan, Nardil, and Parnate medicines for weight loss medicines for allergies, asthma, cold, or cough medicines for depression, anxiety, or psychotic disturbances niacin nicotine NSAIDs, medicines for pain and inflammation, like ibuprofen or naproxen octreotide pasireotide pentamidine phenytoin probenecid quinolone antibiotics such as ciprofloxacin, levofloxacin, ofloxacin some herbal dietary supplements steroid medicines such as prednisone or cortisone sulfamethoxazole; trimethoprim thyroid hormones Some medications can hide the warning symptoms of low blood sugar (hypoglycemia). You may need to monitor your blood sugar more closely if you are taking one of these medications. These include: beta-blockers, often used for high blood pressure or heart problems (examples include atenolol, metoprolol, propranolol) clonidine guanethidine reserpine This list may not describe all possible interactions. Give your health care provider a list of all the medicines, herbs, non-prescription drugs, or dietary supplements you use. Also tell them if you smoke, drink alcohol, or use illegal drugs. Some items may interact with your medicine. What should I watch for while using this medicine? Visit your doctor or health care professional for regular checks on your progress. Drink plenty of fluids while taking this medicine. Check with your doctor or health care professional if you get an attack of severe diarrhea, nausea, and vomiting. The loss of too much body fluid can make it dangerous for you to take this medicine. A test called the HbA1C (A1C) will be monitored. This is a simple blood test. It measures your blood sugar control over the last 2 to 3 months. You will receive this  test every 3 to 6 months. Learn how to check your blood sugar. Learn the symptoms of low and high blood sugar and how to manage them. Always carry a quick-source of sugar with you in case you have symptoms of low blood sugar. Examples include hard sugar candy or glucose tablets. Make sure others know that you can choke if you eat or drink when you develop serious symptoms of low blood sugar, such as seizures or unconsciousness. They must get medical help at once. Tell your doctor or health care professional if you have high blood sugar. You might need to change the dose of your medicine. If you are sick or exercising more than usual, you might need to change the dose of your medicine. Do not skip meals. Ask your doctor or health care professional if you should avoid alcohol. Many nonprescription cough and cold products contain sugar or alcohol. These can affect blood sugar. Pens should never be shared. Even if the needle is changed, sharing may result in passing of viruses like hepatitis or HIV. Wear a medical ID bracelet or chain, and carry a card that describes your disease and details of your medicine and dosage times. Do not become pregnant while taking this medicine. Women should inform their doctor if they wish to become pregnant or think they might be pregnant. There is a potential for serious  side effects to an unborn child. Talk to your health care professional or pharmacist for more information. What side effects may I notice from receiving this medicine? Side effects that you should report to your doctor or health care professional as soon as possible: allergic reactions like skin rash, itching or hives, swelling of the face, lips, or tongue breathing problems changes in vision diarrhea that continues or is severe lump or swelling on the neck severe nausea signs and symptoms of infection like fever or chills; cough; sore throat; pain or trouble passing urine signs and symptoms of low  blood sugar such as feeling anxious, confusion, dizziness, increased hunger, unusually weak or tired, sweating, shakiness, cold, irritable, headache, blurred vision, fast heartbeat, loss of consciousness signs and symptoms of kidney injury like trouble passing urine or change in the amount of urine trouble swallowing unusual stomach upset or pain vomiting Side effects that usually do not require medical attention (report to your doctor or health care professional if they continue or are bothersome): constipation diarrhea nausea pain, redness, or irritation at site where injected stomach upset This list may not describe all possible side effects. Call your doctor for medical advice about side effects. You may report side effects to FDA at 1-800-FDA-1088. Where should I keep my medicine? Keep out of the reach of children. Store unopened pens in a refrigerator between 2 and 8 degrees C (36 and 46 degrees F). Do not freeze. Protect from light and heat. After you first use the pen, it can be stored for 56 days at room temperature between 15 and 30 degrees C (59 and 86 degrees F) or in a refrigerator. Throw away your used pen after 56 days or after the expiration date, whichever comes first. Do not store your pen with the needle attached. If the needle is left on, medicine may leak from the pen. NOTE: This sheet is a summary. It may not cover all possible information. If you have questions about this medicine, talk to your doctor, pharmacist, or health care provider.  2021 Elsevier/Gold Standard (2018-09-05 09:41:51)  This is  Dr. Melina Schools  example of a  "Low GI"  Diet:  It will allow you to lose 4 to 8  lbs  per month if you follow it carefully.  Your goal with exercise is a minimum of 30 minutes of aerobic exercise 5 days per week (Walking does not count once it becomes easy!)    All of the foods can be found at grocery stores and in bulk at [employer].  The Atkins protein bars and shakes are  available in more varieties at Target, WalMart and Lowe's Foods.     7 AM Breakfast:  Choose from the following:  Low carbohydrate Protein  Shakes (I recommend the  Premier Protein chocolate shakes,  EAS AdvantEdge "Carb Control" shakes  Or the Atkins shakes all are under 3 net carbs)     a scrambled egg/bacon/cheese burrito made with Mission's "carb balance" whole wheat tortilla  (about 10 net carbs )  Medical laboratory scientific officer (basically a quiche without the pastry crust) that is eaten cold and very convenient way to get your eggs.  8 carbs)  If you make your own protein shakes, avoid bananas and pineapple,  And use low carb greek yogurt or original /unsweetened almond or soy milk    Avoid cereal and bananas, oatmeal and cream of wheat and grits. They are loaded with carbohydrates!   10 AM: high protein  snack:  Protein bar by Atkins (the snack size, under 200 cal, usually < 6 net carbs).    A stick of cheese:  Around 1 carb,  100 cal     Dannon Light n Fit [place] Yogurt  (80 cal, 8 carbs)  Other so called "protein bars" and Greek yogurts tend to be loaded with carbohydrates.  Remember, in food advertising, the word "energy" is synonymous for " carbohydrate."  Lunch:   A Sandwich using the bread choices listed, Can use any  Eggs,  lunchmeat, grilled meat or canned tuna), avocado, regular mayo/mustard  and cheese.  A Salad using blue cheese, ranch,  Goddess or vinagrette,  Avoid taco shells, croutons or "confetti" and no "candied nuts" but regular nuts OK.   No pretzels, nabs  or chips.  Pickles and miniature sweet peppers are a good low carb alternative that provide a "crunch"  The bread is the only source of carbohydrate in a sandwich and  can be decreased by trying some of the attached alternatives to traditional loaf bread   Avoid "Low fat dressings, as well as Reyne Dumas and [employer] Foods dressings They are loaded with sugar!   3 PM/ Mid day  Snack:  Consider  1 ounce  of  almonds, walnuts, pistachios, pecans, peanuts,  Macadamia nuts or a nut medley.  Avoid "granola and granola bars "  Mixed nuts are ok in moderation as long as there are no raisins,  cranberries or dried fruit.   KIND bars are OK if you get the low glycemic index variety   Try the prosciutto/mozzarella cheese sticks by Fiorruci  In deli /backery section   High protein      6 PM  Dinner:     Meat/fowl/fish with a green salad, and either broccoli, cauliflower, green beans, spinach, brussel sprouts or  Lima beans. DO NOT BREAD THE PROTEIN!!      There is a low carb pasta by Dreamfield's that is acceptable and tastes great: only 5 digestible carbs/serving.( All grocery stores but BJs carry it ) Several ready made meals are available low carb:   Try Michel Angelo's chicken piccata or chicken or eggplant parm over low carb pasta.(Lowes and BJs)   Clifton Custard Sanchez's "Carnitas" (pulled pork, no sauce,  0 carbs) or his beef pot roast to make a dinner burrito (at BJ's)  Pesto over low carb pasta (bj's sells a good quality pesto in the center refrigerated section of the deli   Try satueeing  Roosvelt Harps with mushroooms as a good side   Green Giant makes a mashed cauliflower that tastes like mashed potatoes  Whole wheat pasta is still full of digestible carbs and  Not as low in glycemic index as Dreamfield's.   Brown rice is still rice,  So skip the rice and noodles if you eat [place] or [place] (or at least limit to 1/2 cup)  9 PM snack :   Breyer's "low carb" fudgsicle or  ice cream bar (Carb Smart line), or  Weight Watcher's ice cream bar , or another "no sugar added" ice cream;  a serving of fresh berries/cherries with whipped cream   Cheese or DANNON'S LlGHT N FIT GREEK YOGURT  8 ounces of [place] unsweetened almond/cococunut milk    Treat yourself to a parfait made with whipped cream blueberiies, walnuts and vanilla greek yogurt  Avoid bananas, pineapple, grapes  and watermelon on a regular basis  because they are high in sugar.  THINK OF THEM AS  DESSERT  Remember that snack Substitutions should be less than 10 NET carbs per serving and meals < 20 carbs. Remember to subtract fiber grams to get the "net carbs."

## 2020-11-24 ENCOUNTER — Telehealth: Payer: Self-pay | Admitting: Family

## 2020-11-25 ENCOUNTER — Encounter: Payer: Self-pay | Admitting: Family

## 2020-11-26 ENCOUNTER — Other Ambulatory Visit: Payer: Self-pay | Admitting: Family

## 2020-11-26 ENCOUNTER — Other Ambulatory Visit: Payer: Self-pay

## 2020-11-26 ENCOUNTER — Ambulatory Visit: Payer: 59

## 2020-11-26 DIAGNOSIS — R635 Abnormal weight gain: Secondary | ICD-10-CM

## 2020-11-26 MED ORDER — TIRZEPATIDE 2.5 MG/0.5ML ~~LOC~~ SOAJ
2.5000 mg | SUBCUTANEOUS | 1 refills | Status: DC
  Filled 2020-11-26: qty 2, 28d supply, fill #0

## 2020-12-12 ENCOUNTER — Other Ambulatory Visit: Payer: Self-pay | Admitting: Family

## 2020-12-12 ENCOUNTER — Other Ambulatory Visit: Payer: Self-pay

## 2020-12-12 DIAGNOSIS — R635 Abnormal weight gain: Secondary | ICD-10-CM

## 2020-12-12 MED ORDER — TIRZEPATIDE 5 MG/0.5ML ~~LOC~~ SOAJ
5.0000 mg | SUBCUTANEOUS | 1 refills | Status: DC
  Filled 2020-12-12: qty 2, 28d supply, fill #0

## 2020-12-15 ENCOUNTER — Other Ambulatory Visit: Payer: Self-pay | Admitting: Family

## 2020-12-15 ENCOUNTER — Other Ambulatory Visit: Payer: Self-pay

## 2020-12-15 DIAGNOSIS — R635 Abnormal weight gain: Secondary | ICD-10-CM

## 2020-12-15 MED ORDER — OZEMPIC (0.25 OR 0.5 MG/DOSE) 2 MG/1.5ML ~~LOC~~ SOPN
0.5000 mg | PEN_INJECTOR | SUBCUTANEOUS | 3 refills | Status: DC
  Filled 2020-12-15: qty 1.5, 28d supply, fill #0
  Filled 2020-12-30: qty 1.5, 28d supply, fill #1

## 2020-12-16 ENCOUNTER — Other Ambulatory Visit: Payer: 59

## 2020-12-17 ENCOUNTER — Other Ambulatory Visit: Payer: Self-pay

## 2020-12-19 ENCOUNTER — Other Ambulatory Visit: Payer: Self-pay

## 2020-12-30 ENCOUNTER — Other Ambulatory Visit: Payer: 59

## 2020-12-30 ENCOUNTER — Other Ambulatory Visit: Payer: Self-pay

## 2021-01-06 ENCOUNTER — Other Ambulatory Visit: Payer: Self-pay

## 2021-01-07 ENCOUNTER — Other Ambulatory Visit: Payer: Self-pay | Admitting: Family

## 2021-01-07 ENCOUNTER — Other Ambulatory Visit: Payer: Self-pay

## 2021-01-07 DIAGNOSIS — R635 Abnormal weight gain: Secondary | ICD-10-CM

## 2021-01-07 MED ORDER — OZEMPIC (1 MG/DOSE) 4 MG/3ML ~~LOC~~ SOPN
1.0000 mg | PEN_INJECTOR | SUBCUTANEOUS | 2 refills | Status: DC
  Filled 2021-01-07: qty 3, 28d supply, fill #0
  Filled 2021-01-28: qty 3, 28d supply, fill #1

## 2021-01-29 ENCOUNTER — Other Ambulatory Visit: Payer: Self-pay

## 2021-02-02 ENCOUNTER — Other Ambulatory Visit: Payer: Self-pay

## 2021-02-02 ENCOUNTER — Other Ambulatory Visit: Payer: Self-pay | Admitting: Family

## 2021-02-02 DIAGNOSIS — R635 Abnormal weight gain: Secondary | ICD-10-CM

## 2021-02-02 MED ORDER — SEMAGLUTIDE (2 MG/DOSE) 8 MG/3ML ~~LOC~~ SOPN
2.0000 mg | PEN_INJECTOR | SUBCUTANEOUS | 1 refills | Status: DC
  Filled 2021-02-02: qty 3, 28d supply, fill #0
  Filled 2021-02-20: qty 3, 28d supply, fill #1

## 2021-02-15 IMAGING — MG DIGITAL SCREENING BILAT W/ TOMO W/ CAD
6 of 10 series · 6 of 30 positions shown · non-contrast
Comparison: Previous exam(s).

CLINICAL DATA: Screening. Reduction in 7881

EXAM:
DIGITAL SCREENING BILATERAL MAMMOGRAM WITH TOMO AND CAD

[L CC synth-2D]
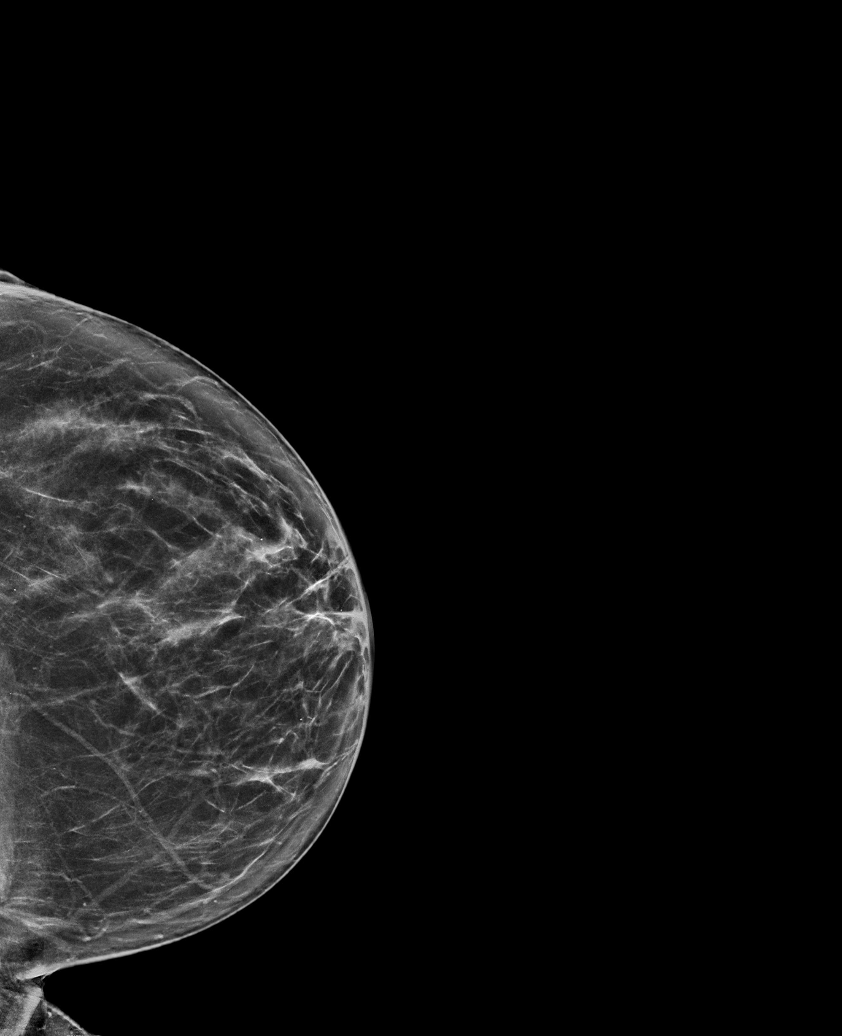

[R MLO synth-2D (1 of 2)]
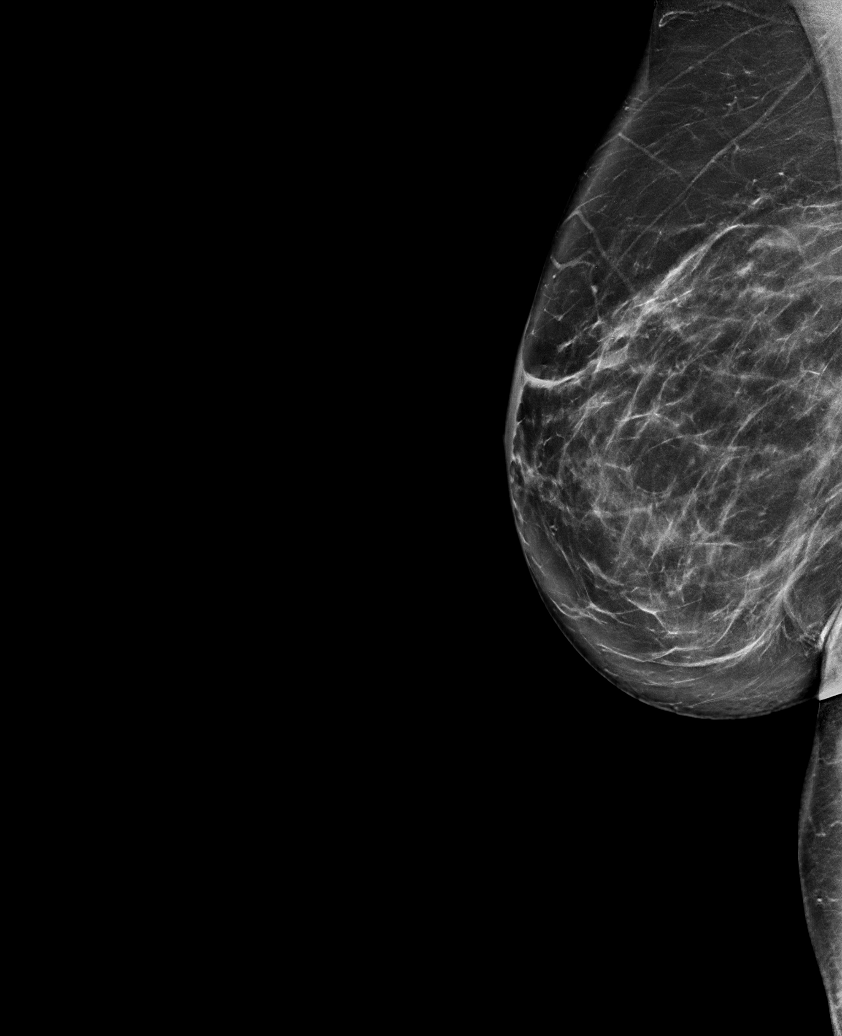

[R CC synth-2D]
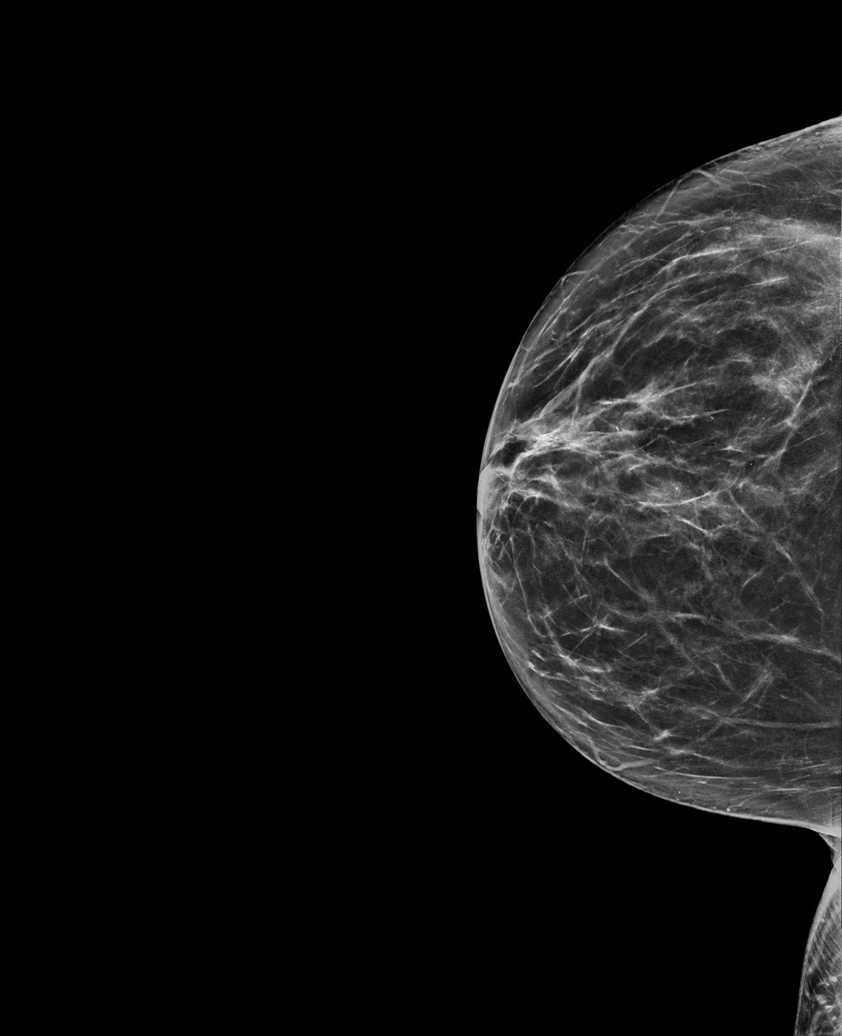

[R MLO synth-2D (2 of 2)]
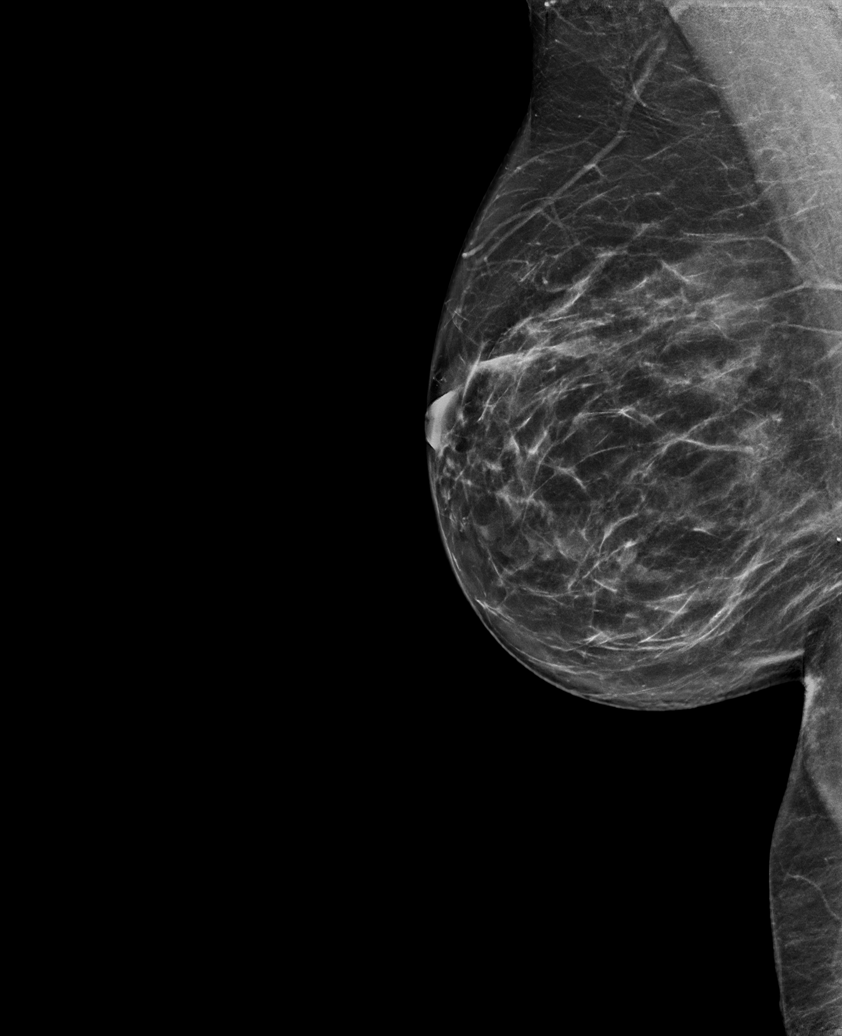

[L MLO synth-2D]
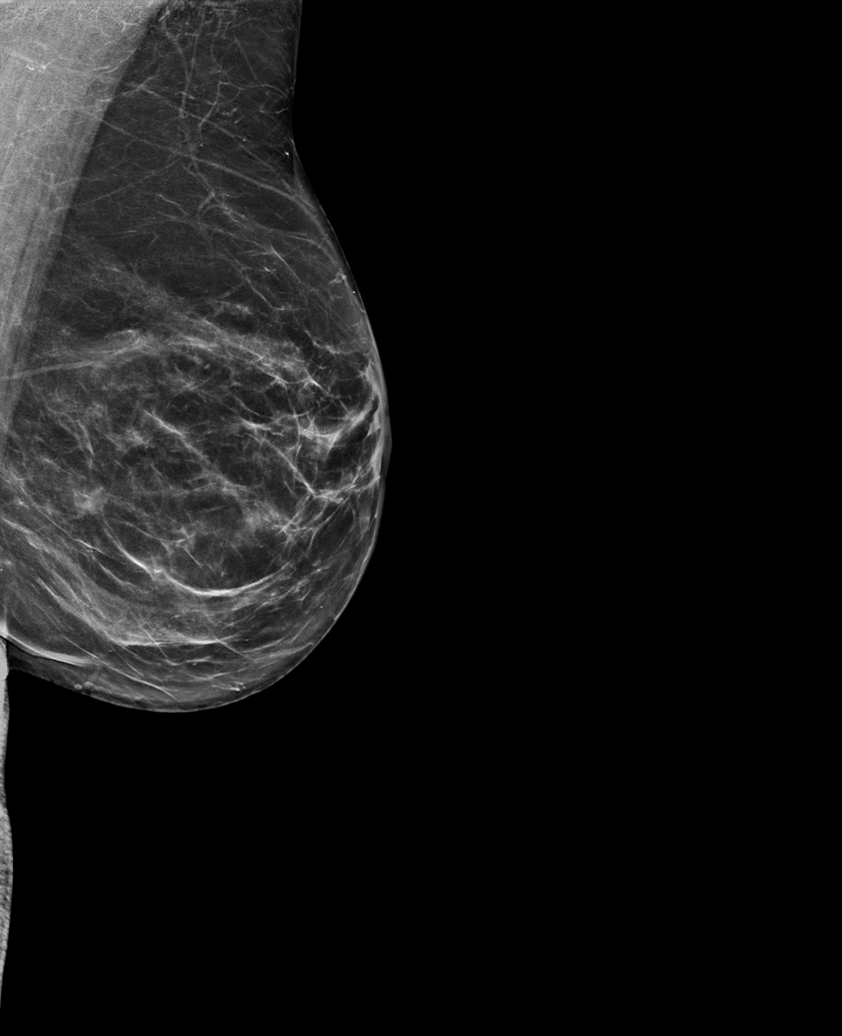

[R MLO tomo · tomo slice 38/75.0]
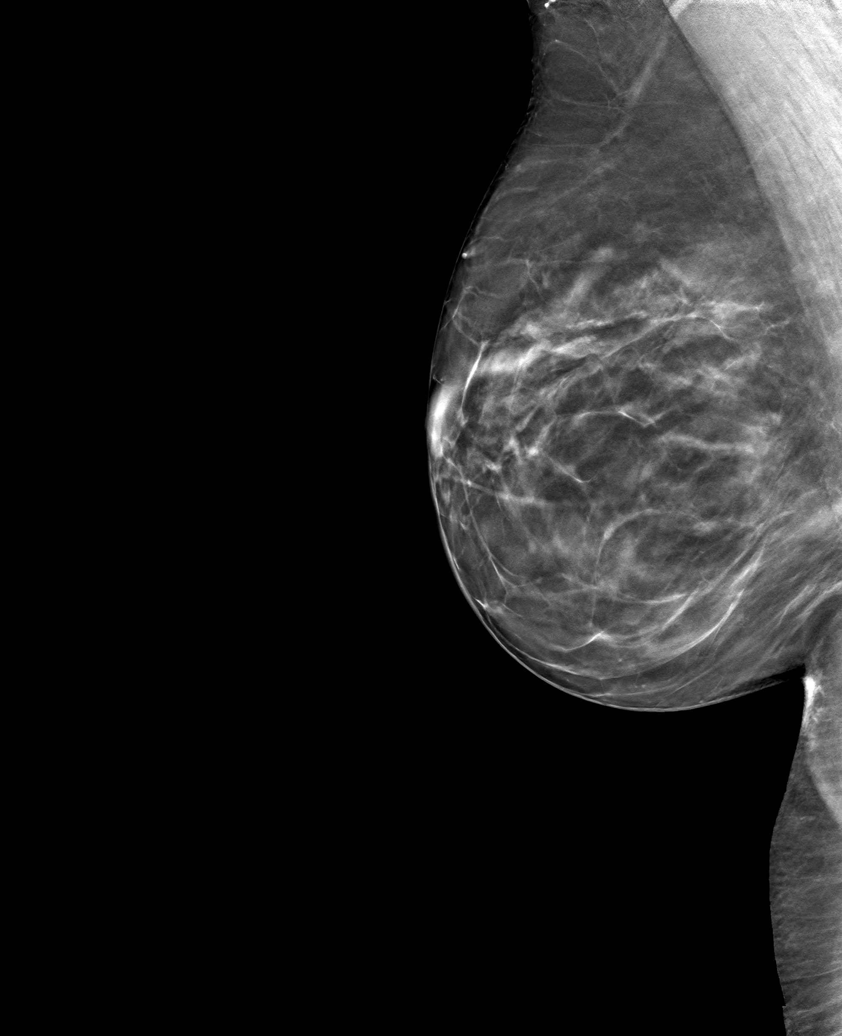

[6 of 30 positions shown; findings below may reference images not displayed]

ACR Breast Density Category b: There are scattered areas of
fibroglandular density.
FINDINGS: There are no findings suspicious for malignancy. Post reduction
changes. Images were processed with CAD.
IMPRESSION: No mammographic evidence of malignancy. A result letter of this
screening mammogram will be mailed directly to the patient.

RECOMMENDATION:
Screening mammogram in one year. (Code:[id number])

BI-RADS CATEGORY  2: Benign.

## 2021-02-23 ENCOUNTER — Other Ambulatory Visit: Payer: Self-pay

## 2021-02-24 ENCOUNTER — Other Ambulatory Visit: Payer: Self-pay

## 2021-03-19 ENCOUNTER — Other Ambulatory Visit: Payer: Self-pay | Admitting: Family

## 2021-03-19 ENCOUNTER — Encounter: Payer: Self-pay | Admitting: Family

## 2021-03-20 ENCOUNTER — Other Ambulatory Visit: Payer: Self-pay

## 2021-03-23 ENCOUNTER — Other Ambulatory Visit: Payer: Self-pay

## 2021-03-23 MED ORDER — OZEMPIC (2 MG/DOSE) 8 MG/3ML ~~LOC~~ SOPN
2.0000 mg | PEN_INJECTOR | SUBCUTANEOUS | 1 refills | Status: DC
  Filled 2021-03-23: qty 3, 28d supply, fill #0

## 2021-03-24 ENCOUNTER — Other Ambulatory Visit: Payer: Self-pay | Admitting: Family

## 2021-03-24 ENCOUNTER — Other Ambulatory Visit: Payer: Self-pay

## 2021-03-24 DIAGNOSIS — R635 Abnormal weight gain: Secondary | ICD-10-CM

## 2021-03-24 MED ORDER — WEGOVY 1.7 MG/0.75ML ~~LOC~~ SOAJ
1.7000 mg | SUBCUTANEOUS | 2 refills | Status: DC
  Filled 2021-03-24 – 2021-05-25 (×5): qty 3, 28d supply, fill #0
  Filled 2021-06-14: qty 3, 28d supply, fill #1
  Filled 2021-07-15: qty 3, 28d supply, fill #2

## 2021-03-27 ENCOUNTER — Telehealth: Payer: Self-pay

## 2021-03-27 ENCOUNTER — Other Ambulatory Visit: Payer: Self-pay

## 2021-03-27 NOTE — Telephone Encounter (Signed)
PA completed awaiting response.

## 2021-04-01 ENCOUNTER — Other Ambulatory Visit: Payer: Self-pay

## 2021-04-01 NOTE — Telephone Encounter (Signed)
Spoke to patient and she stated that they stated to her that she needed to check with provider on why they denied PA. Stated to patient that we would have to know exactly why they denied it to before we could or if we could do an appeal ?

## 2021-04-14 NOTE — Telephone Encounter (Signed)
Call pt ?Wegovy denied ?She can make f/u to discuss alternatives ? ? ?

## 2021-04-17 NOTE — Telephone Encounter (Signed)
Spoke to patient about being denied for [facility]. Patient stated that the reason for the denial was because she was taking Ozempic, but it actually is supposed to be switching from Ozempic to the [facility]. Patient stated that she is going to bring in a letter to begin an appeal process ?

## 2021-04-21 ENCOUNTER — Other Ambulatory Visit: Payer: Self-pay

## 2021-04-24 ENCOUNTER — Other Ambulatory Visit: Payer: Self-pay

## 2021-04-30 ENCOUNTER — Telehealth: Payer: Self-pay | Admitting: Family

## 2021-04-30 NOTE — Telephone Encounter (Signed)
Spoke to patient about her message and explained to her that we did not have any samples in office to give her at this time, but if we got any in [place]. ?

## 2021-04-30 NOTE — Telephone Encounter (Signed)
Pt called in stating that she is in an appeal with her insurance company for ozempic because they she is still taking it but she is taking wegovy. While she is waiting on the appeal she does not have any more wegovy and want to know if we have any samples  ?

## 2021-05-01 NOTE — Telephone Encounter (Signed)
PA sent in again on 4/28 ?

## 2021-05-01 NOTE — Telephone Encounter (Signed)
Spoke to patient to inform her of PA sent again ?

## 2021-05-19 NOTE — Telephone Encounter (Signed)
Spoke to patient and informed her that we did receive the form from [facility] and I would get it filled out and fax back to them tomorrow on 05/20/21 ?

## 2021-05-25 ENCOUNTER — Other Ambulatory Visit: Payer: Self-pay

## 2021-05-26 ENCOUNTER — Other Ambulatory Visit: Payer: Self-pay

## 2021-05-26 DIAGNOSIS — R635 Abnormal weight gain: Secondary | ICD-10-CM

## 2021-06-11 ENCOUNTER — Telehealth: Payer: Self-pay

## 2021-06-11 NOTE — Telephone Encounter (Signed)
Spoke to patient about wegovy PA denial but she stated to me that she had gotten it approved already through [facility]

## 2021-06-14 ENCOUNTER — Other Ambulatory Visit: Payer: Self-pay

## 2021-06-16 ENCOUNTER — Other Ambulatory Visit: Payer: Self-pay

## 2021-07-15 ENCOUNTER — Other Ambulatory Visit: Payer: Self-pay

## 2021-08-05 ENCOUNTER — Encounter: Payer: 59 | Admitting: Family

## 2021-08-21 ENCOUNTER — Other Ambulatory Visit: Payer: Self-pay

## 2021-08-21 ENCOUNTER — Other Ambulatory Visit: Payer: Self-pay | Admitting: Family

## 2021-08-21 DIAGNOSIS — R635 Abnormal weight gain: Secondary | ICD-10-CM

## 2021-08-21 MED ORDER — WEGOVY 1.7 MG/0.75ML ~~LOC~~ SOAJ
1.7000 mg | SUBCUTANEOUS | 2 refills | Status: DC
  Filled 2021-08-21: qty 3, 28d supply, fill #0

## 2021-09-02 ENCOUNTER — Ambulatory Visit (INDEPENDENT_AMBULATORY_CARE_PROVIDER_SITE_OTHER): Payer: No Typology Code available for payment source | Admitting: Family

## 2021-09-02 ENCOUNTER — Encounter: Payer: Self-pay | Admitting: Family

## 2021-09-02 ENCOUNTER — Other Ambulatory Visit: Payer: Self-pay

## 2021-09-02 VITALS — BP 122/78 | HR 86 | Temp 97.7°F | Ht 61.0 in | Wt 133.4 lb

## 2021-09-02 DIAGNOSIS — Z1231 Encounter for screening mammogram for malignant neoplasm of breast: Secondary | ICD-10-CM

## 2021-09-02 DIAGNOSIS — R011 Cardiac murmur, unspecified: Secondary | ICD-10-CM

## 2021-09-02 DIAGNOSIS — R635 Abnormal weight gain: Secondary | ICD-10-CM

## 2021-09-02 DIAGNOSIS — Z0000 Encounter for general adult medical examination without abnormal findings: Secondary | ICD-10-CM

## 2021-09-02 MED ORDER — WEGOVY 2.4 MG/0.75ML ~~LOC~~ SOAJ
2.4000 mg | SUBCUTANEOUS | 11 refills | Status: DC
  Filled 2021-09-02: qty 3, 28d supply, fill #0
  Filled 2021-09-28: qty 3, 28d supply, fill #1

## 2021-09-02 NOTE — Assessment & Plan Note (Signed)
  Known murmur since childhood.  Denies dizziness shortness of breath fatigue.  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  She politely declines baseline echocardiogram at this time.  She will continue to monitor and certainly let me know if symptoms were to develop

## 2021-09-02 NOTE — Progress Notes (Signed)
Subjective:    Patient ID: Sarah Hill, female    DOB: [birth year], 41 y.o.   MRN: [id number]  CC: ATZIRI Hill is a 41 y.o. female who presents today for physical exam.    HPI: Feels well today.  No new complaints. She has done well on Wegovy and would like to stay on medication indefinitely for weight maintenance.  She would also be interested in increasing dose as she has noticed lately she has gained a couple of pounds back .  she lost about 28 lbs. she is tolerating medication well.  Denies nausea, constipation  Known murmur since childhood.  Denies dizziness shortness of breath fatigue.  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    Colorectal Cancer Screening: No early family history of colon cancer Breast Cancer Screening: Mammogram due; she states this is ordered through her OB/GYN Cervical Cancer Screening: History of HPV positive Pap smear in 2019.  She continues to follow with OB/GYN, scheduled 11/2021.         Tetanus - UTD       Hepatitis C screening - Candidate for, declines HIV Screening- Candidate for , declines Labs: Screening labs today. Exercise: Gets regular exercise with weight training 3 times per week.  Alcohol use:  none Smoking/tobacco use: Nonsmoker.     HISTORY:  Past Medical History:  Diagnosis Date   History of stomach ulcers 2011    Past Surgical History:  Procedure Laterality Date   BREAST REDUCTION SURGERY  07/2014   BREAST REDUCTION SURGERY  2017   REDUCTION MAMMAPLASTY Bilateral 2016   TONSILLECTOMY     TONSILLECTOMY  2007   Family History  Problem Relation Age of Onset   Hypertension Mother    Diabetes Mother    Hearing loss Mother    Coronary artery disease Father 2       stent   Hearing loss Maternal Grandmother    Breast cancer Paternal Aunt 65   Breast cancer Paternal Aunt 41   Clotting disorder Neg Hx    Thyroid cancer Neg Hx        ALLERGIES: Amoxicillin-pot clavulanate  No current outpatient medications on file prior to visit.   No current facility-administered medications on file prior to visit.    Social History   Tobacco Use   Smoking status: Never   Smokeless tobacco: Never  Vaping Use   Vaping Use: Never used  Substance Use Topics   Alcohol use: Not Currently    Alcohol/week: 1.0 standard drink of alcohol    Types: 1 Glasses of wine per week   Drug use: No    Review of Systems  Constitutional:  Negative for chills, fever and unexpected weight change.  HENT:  Negative for congestion.   Respiratory:  Negative for cough and shortness of breath.   Cardiovascular:  Negative for chest pain, palpitations and leg swelling.  Gastrointestinal:  Negative for nausea and vomiting.  Musculoskeletal:  Negative for arthralgias and myalgias.  Skin:  Negative for rash.  Neurological:  Negative for dizziness and headaches.  Hematological:  Negative for adenopathy.  Psychiatric/Behavioral:  Negative for confusion.       Objective:    BP 122/78 (BP Location: Left Arm, Patient Position: Sitting, Cuff Size: Normal)   Pulse 86   Temp 97.7 F (36.5 C) (Oral)   Ht 5\' 1"  (1.549 m)   Wt 133 lb 6.4 oz (60.5 kg)  LMP  (LMP Unknown)   SpO2 99%   BMI 25.21 kg/m   BP Readings from Last 3 Encounters:  09/02/21 122/78  11/18/20 120/75  06/16/20 108/76   Wt Readings from Last 3 Encounters:  09/02/21 133 lb 6.4 oz (60.5 kg)  11/18/20 158 lb (71.7 kg)  06/16/20 152 lb (68.9 kg)    Physical Exam Vitals reviewed.  Constitutional:      Appearance: Normal appearance. She is well-developed.  Eyes:     Conjunctiva/sclera: Conjunctivae normal.  Neck:     Thyroid: No thyroid mass or thyromegaly.  Cardiovascular:     Rate and Rhythm: Normal rate and regular rhythm.     Pulses: Normal pulses.     Heart sounds: Normal heart sounds.     Comments: Faint grade 1 systolic murmur heard on exam Pulmonary:      Effort: Pulmonary effort is normal.     Breath sounds: Normal breath sounds. No wheezing, rhonchi or rales.  Chest:  Breasts:    Breasts are symmetrical.     Right: No inverted nipple, mass, nipple discharge, skin change or tenderness.     Left: No inverted nipple, mass, nipple discharge, skin change or tenderness.  Abdominal:     General: Bowel sounds are normal. There is no distension.     Palpations: Abdomen is soft. Abdomen is not rigid. There is no fluid wave or mass.     Tenderness: There is no abdominal tenderness. There is no guarding or rebound.  Lymphadenopathy:     Head:     Right side of head: No submental, submandibular, tonsillar, preauricular, posterior auricular or occipital adenopathy.     Left side of head: No submental, submandibular, tonsillar, preauricular, posterior auricular or occipital adenopathy.     Cervical: No cervical adenopathy.     Right cervical: No superficial, deep or posterior cervical adenopathy.    Left cervical: No superficial, deep or posterior cervical adenopathy.  Skin:    General: Skin is warm and dry.  Neurological:     Mental Status: She is alert.  Psychiatric:        Speech: Speech normal.        Behavior: Behavior normal.        Thought Content: Thought content normal.        Assessment & Plan:   Problem List Items Addressed This Visit       Other   Heart murmur     Known murmur since childhood.  Denies dizziness shortness of breath fatigue.  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  She politely declines baseline echocardiogram at this time.  She will continue to monitor and certainly let me know if symptoms were to develop      Routine physical examination - Primary   Relevant Orders   CBC with Differential/Platelet   Comprehensive metabolic panel   Hemoglobin A1c   Lipid panel   TSH   VITAMIN D 25 Hydroxy (Vit-D Deficiency, Fractures)    Weight gain    She has done well on wegovy and tolerating .We agreed to continue [facility] for maintenance; I have increased Wegovy to 2.4 mg      Relevant Medications   Semaglutide-Weight Management (WEGOVY) 2.4 MG/0.75ML SOAJ   Other Visit Diagnoses     Encounter for screening mammogram for malignant neoplasm of breast            I have discontinued Harriett Sine  E. Oakley's [id number]. I am also having her start on Wegovy.   Meds ordered this encounter  Medications   Semaglutide-Weight Management (WEGOVY) 2.4 MG/0.75ML SOAJ    Sig: Inject 2.4 mg into the skin once a week.    Dispense:  6 mL    Refill:  11    Order Specific Question:   Supervising Provider    Answer:   Sherlene Shams [[id number]]    Return precautions given.   Risks, benefits, and alternatives of the medications and treatment plan prescribed today were discussed, and patient expressed understanding.   Education regarding symptom management and diagnosis given to patient on AVS.   Continue to follow with Allegra Grana, FNP for routine health maintenance.   Sarah Hill and I agreed with plan.   Rennie Plowman, FNP

## 2021-09-02 NOTE — Assessment & Plan Note (Signed)
She has done well on wegovy and tolerating .We agreed to continue [facility] for maintenance; I have increased Wegovy to 2.4 mg

## 2021-09-02 NOTE — Assessment & Plan Note (Signed)
Clinical breast exam performed today.  Patient follows with GYN and is scheduled for Pap smear and she also states GYN will order mammogram.  Congratulated patient on diligence to exercise.

## 2021-09-02 NOTE — Patient Instructions (Signed)
Nice to see you! Health Maintenance, Female Adopting a healthy lifestyle and getting preventive care are important in promoting health and wellness. Ask your health care provider about: The right schedule for you to have regular tests and exams. Things you can do on your own to prevent diseases and keep yourself healthy. What should I know about diet, weight, and exercise? Eat a healthy diet  Eat a diet that includes plenty of vegetables, fruits, low-fat dairy products, and lean protein. Do not eat a lot of foods that are high in solid fats, added sugars, or sodium. Maintain a healthy weight Body mass index (BMI) is used to identify weight problems. It estimates body fat based on height and weight. Your health care provider can help determine your BMI and help you achieve or maintain a healthy weight. Get regular exercise Get regular exercise. This is one of the most important things you can do for your health. Most adults should: Exercise for at least 150 minutes each week. The exercise should increase your heart rate and make you sweat (moderate-intensity exercise). Do strengthening exercises at least twice a week. This is in addition to the moderate-intensity exercise. Spend less time sitting. Even light physical activity can be beneficial. Watch cholesterol and blood lipids Have your blood tested for lipids and cholesterol at 43 years of age, then have this test every 5 years. Have your cholesterol levels checked more often if: Your lipid or cholesterol levels are high. You are older than 43 years of age. You are at high risk for heart disease. What should I know about cancer screening? Depending on your health history and family history, you may need to have cancer screening at various ages. This may include screening for: Breast cancer. Cervical cancer. Colorectal cancer. Skin cancer. Lung cancer. What should I know about heart disease, diabetes, and high blood pressure? Blood  pressure and heart disease High blood pressure causes heart disease and increases the risk of stroke. This is more likely to develop in people who have high blood pressure readings or are overweight. Have your blood pressure checked: Every 3-5 years if you are 18-39 years of age. Every year if you are 40 years old or older. Diabetes Have regular diabetes screenings. This checks your fasting blood sugar level. Have the screening done: Once every three years after age 40 if you are at a normal weight and have a low risk for diabetes. More often and at a younger age if you are overweight or have a high risk for diabetes. What should I know about preventing infection? Hepatitis B If you have a higher risk for hepatitis B, you should be screened for this virus. Talk with your health care provider to find out if you are at risk for hepatitis B infection. Hepatitis C Testing is recommended for: Everyone born from 1945 through 1965. Anyone with known risk factors for hepatitis C. Sexually transmitted infections (STIs) Get screened for STIs, including gonorrhea and chlamydia, if: You are sexually active and are younger than 43 years of age. You are older than 43 years of age and your health care provider tells you that you are at risk for this type of infection. Your sexual activity has changed since you were last screened, and you are at increased risk for chlamydia or gonorrhea. Ask your health care provider if you are at risk. Ask your health care provider about whether you are at high risk for HIV. Your health care provider may recommend a prescription medicine   to help prevent HIV infection. If you choose to take medicine to prevent HIV, you should first get tested for HIV. You should then be tested every 3 months for as long as you are taking the medicine. Pregnancy If you are about to stop having your period (premenopausal) and you may become pregnant, seek counseling before you get  pregnant. Take 400 to 800 micrograms (mcg) of folic acid every day if you become pregnant. Ask for birth control (contraception) if you want to prevent pregnancy. Osteoporosis and menopause Osteoporosis is a disease in which the bones lose minerals and strength with aging. This can result in bone fractures. If you are 65 years old or older, or if you are at risk for osteoporosis and fractures, ask your health care provider if you should: Be screened for bone loss. Take a calcium or vitamin D supplement to lower your risk of fractures. Be given hormone replacement therapy (HRT) to treat symptoms of menopause. Follow these instructions at home: Alcohol use Do not drink alcohol if: Your health care provider tells you not to drink. You are pregnant, may be pregnant, or are planning to become pregnant. If you drink alcohol: Limit how much you have to: 0-1 drink a day. Know how much alcohol is in your drink. In the [place] one drink equals one 12 oz bottle of beer (355 mL), one 5 oz glass of wine (148 mL), or one 1 oz glass of hard liquor (44 mL). Lifestyle Do not use any products that contain nicotine or tobacco. These products include cigarettes, chewing tobacco, and vaping devices, such as e-cigarettes. If you need help quitting, ask your health care provider. Do not use street drugs. Do not share needles. Ask your health care provider for help if you need support or information about quitting drugs. General instructions Schedule regular health, dental, and eye exams. Stay current with your vaccines. Tell your health care provider if: You often feel depressed. You have ever been abused or do not feel safe at home. Summary Adopting a healthy lifestyle and getting preventive care are important in promoting health and wellness. Follow your health care provider's instructions about healthy diet, exercising, and getting tested or screened for diseases. Follow your health care provider's  instructions on monitoring your cholesterol and blood pressure. This information is not intended to replace advice given to you by your health care provider. Make sure you discuss any questions you have with your health care provider. Document Revised: 05/12/2020 Document Reviewed: 05/12/2020 Elsevier Patient Education  2023 [employer]  

## 2021-09-02 NOTE — Progress Notes (Signed)
Not fasting. Labs are for future

## 2021-09-23 ENCOUNTER — Other Ambulatory Visit: Payer: No Typology Code available for payment source

## 2021-09-28 ENCOUNTER — Encounter: Payer: Self-pay | Admitting: Family

## 2021-09-29 ENCOUNTER — Other Ambulatory Visit: Payer: Self-pay

## 2021-10-05 ENCOUNTER — Other Ambulatory Visit: Payer: Self-pay | Admitting: Family

## 2021-10-05 ENCOUNTER — Other Ambulatory Visit: Payer: Self-pay

## 2021-10-05 DIAGNOSIS — R635 Abnormal weight gain: Secondary | ICD-10-CM

## 2021-10-05 MED ORDER — WEGOVY 1.7 MG/0.75ML ~~LOC~~ SOAJ
1.7000 mg | SUBCUTANEOUS | 3 refills | Status: DC
  Filled 2021-10-05: qty 3, 28d supply, fill #0
  Filled 2021-10-29 – 2021-11-16 (×2): qty 3, 28d supply, fill #1
  Filled 2021-12-09: qty 3, 28d supply, fill #2
  Filled 2022-01-06: qty 3, 28d supply, fill #3

## 2021-10-13 ENCOUNTER — Other Ambulatory Visit: Payer: No Typology Code available for payment source

## 2021-10-29 ENCOUNTER — Other Ambulatory Visit: Payer: Self-pay

## 2021-11-13 ENCOUNTER — Other Ambulatory Visit: Payer: Self-pay

## 2021-11-16 ENCOUNTER — Other Ambulatory Visit: Payer: Self-pay

## 2021-11-25 ENCOUNTER — Other Ambulatory Visit: Payer: Self-pay | Admitting: Family

## 2021-11-25 ENCOUNTER — Encounter: Payer: Self-pay | Admitting: Family

## 2021-12-09 ENCOUNTER — Other Ambulatory Visit: Payer: Self-pay

## 2022-01-06 ENCOUNTER — Other Ambulatory Visit: Payer: Self-pay

## 2022-02-05 ENCOUNTER — Other Ambulatory Visit: Payer: Self-pay

## 2022-02-05 ENCOUNTER — Other Ambulatory Visit: Payer: Self-pay | Admitting: Family

## 2022-02-05 DIAGNOSIS — R635 Abnormal weight gain: Secondary | ICD-10-CM

## 2022-02-05 MED ORDER — WEGOVY 1.7 MG/0.75ML ~~LOC~~ SOAJ
1.7000 mg | SUBCUTANEOUS | 3 refills | Status: DC
  Filled 2022-02-05: qty 3, 28d supply, fill #0

## 2022-02-09 ENCOUNTER — Telehealth (INDEPENDENT_AMBULATORY_CARE_PROVIDER_SITE_OTHER): Payer: Commercial Managed Care - PPO | Admitting: Family

## 2022-02-09 ENCOUNTER — Encounter: Payer: Self-pay | Admitting: Family

## 2022-02-09 ENCOUNTER — Other Ambulatory Visit: Payer: Self-pay

## 2022-02-09 VITALS — BP 118/70 | HR 75

## 2022-02-09 DIAGNOSIS — R635 Abnormal weight gain: Secondary | ICD-10-CM

## 2022-02-09 MED ORDER — WEGOVY 2.4 MG/0.75ML ~~LOC~~ SOAJ
2.4000 mg | SUBCUTANEOUS | 3 refills | Status: DC
  Filled 2022-02-09: qty 3, 28d supply, fill #0
  Filled 2022-03-03: qty 3, 28d supply, fill #1
  Filled 2022-03-30: qty 3, 28d supply, fill #2
  Filled 2022-04-25: qty 3, 28d supply, fill #3

## 2022-02-09 NOTE — Progress Notes (Signed)
Virtual Visit via Video Note  I connected with Sarah Hill on 02/09/22 at  9:30 AM EST by a video enabled telemedicine application and verified that I am speaking with the correct person using two identifiers. Location patient: home Location provider: work  Persons participating in the virtual visit: patient, provider  I discussed the limitations of evaluation and management by telemedicine and the availability of in person appointments. The patient expressed understanding and agreed to proceed.  HPI: She has feels 'overall better' . Energy has improved.  She has tolerated wegovy very well . No constipation.  Appetite suppression has waned slightly since starting medication.  She is interested in increasing medication She has lost nearly 30 pounds.  She would like to loose additional 8 pounds.    BMI of 30 11/18/2020   ROS: See pertinent positives and negatives per HPI.  EXAM:  VITALS per patient if applicable: BP 235/57   Pulse 75   LMP 02/05/2022   SpO2 96%  BP Readings from Last 3 Encounters:  02/09/22 118/70  09/02/21 122/78  11/18/20 120/75   Wt Readings from Last 3 Encounters:  09/02/21 133 lb 6.4 oz (60.5 kg)  11/18/20 158 lb (71.7 kg)  06/16/20 152 lb (68.9 kg)    GENERAL: alert, oriented, appears well and in no acute distress  HEENT: atraumatic, conjunttiva clear, no obvious abnormalities on inspection of external nose and ears  NECK: normal movements of the head and neck  LUNGS: on inspection no signs of respiratory distress, breathing rate appears normal, no obvious gross SOB, gasping or wheezing  CV: no obvious cyanosis  MS: moves all visible extremities without noticeable abnormality  PSYCH/NEURO: pleasant and cooperative, no obvious depression or anxiety, speech and thought processing grossly intact  ASSESSMENT AND PLAN: Weight gain Assessment & Plan: Congratulated patient on weight loss.  We agreed to increase Wegovy to 2.4 mg for appetite  suppression.  Orders: -     Wegovy; Inject 2.4 mg into the skin once a week.  Dispense: 3 mL; Refill: 3     -we discussed possible serious and likely etiologies, options for evaluation and workup, limitations of telemedicine visit vs in person visit, treatment, treatment risks and precautions. Pt prefers to treat via telemedicine empirically rather then risking or undertaking an in person visit at this moment.    I discussed the assessment and treatment plan with the patient. The patient was provided an opportunity to ask questions and all were answered. The patient agreed with the plan and demonstrated an understanding of the instructions.   The patient was advised to call back or seek an in-person evaluation if the symptoms worsen or if the condition fails to improve as anticipated.  Advised if desired AVS can be mailed or viewed via [facility] if [place] user.   Mable Paris, FNP

## 2022-02-09 NOTE — Assessment & Plan Note (Signed)
Congratulated patient on weight loss.  We agreed to increase Wegovy to 2.4 mg for appetite suppression.

## 2022-02-24 ENCOUNTER — Telehealth: Payer: Commercial Managed Care - PPO | Admitting: Family

## 2022-06-08 ENCOUNTER — Other Ambulatory Visit: Payer: Self-pay | Admitting: Family

## 2022-06-08 DIAGNOSIS — R635 Abnormal weight gain: Secondary | ICD-10-CM

## 2022-06-09 ENCOUNTER — Other Ambulatory Visit: Payer: Self-pay

## 2022-06-09 MED ORDER — WEGOVY 2.4 MG/0.75ML ~~LOC~~ SOAJ
2.4000 mg | SUBCUTANEOUS | 3 refills | Status: DC
  Filled 2022-06-09 – 2022-07-02 (×2): qty 3, 28d supply, fill #0
  Filled 2022-08-12 – 2022-08-29 (×2): qty 3, 28d supply, fill #1
  Filled 2022-09-25 – 2022-10-15 (×3): qty 3, 28d supply, fill #2
  Filled 2022-11-21: qty 3, 28d supply, fill #3

## 2022-06-24 ENCOUNTER — Other Ambulatory Visit: Payer: Self-pay

## 2022-07-02 ENCOUNTER — Other Ambulatory Visit: Payer: Self-pay

## 2022-07-05 ENCOUNTER — Other Ambulatory Visit: Payer: Self-pay

## 2022-07-06 ENCOUNTER — Other Ambulatory Visit: Payer: Self-pay

## 2022-08-13 ENCOUNTER — Other Ambulatory Visit: Payer: Self-pay

## 2022-08-19 ENCOUNTER — Encounter (INDEPENDENT_AMBULATORY_CARE_PROVIDER_SITE_OTHER): Payer: Self-pay

## 2022-08-20 ENCOUNTER — Other Ambulatory Visit: Payer: Self-pay

## 2022-08-29 ENCOUNTER — Other Ambulatory Visit: Payer: Self-pay

## 2022-08-30 ENCOUNTER — Other Ambulatory Visit: Payer: Self-pay

## 2022-09-10 ENCOUNTER — Encounter: Payer: Commercial Managed Care - PPO | Admitting: Family

## 2022-09-26 ENCOUNTER — Other Ambulatory Visit (HOSPITAL_COMMUNITY): Payer: Self-pay

## 2022-10-06 ENCOUNTER — Other Ambulatory Visit (HOSPITAL_COMMUNITY): Payer: Self-pay

## 2022-10-15 ENCOUNTER — Other Ambulatory Visit: Payer: Self-pay

## 2022-11-22 ENCOUNTER — Other Ambulatory Visit (HOSPITAL_COMMUNITY): Payer: Self-pay

## 2022-12-17 ENCOUNTER — Telehealth: Payer: Self-pay

## 2022-12-17 NOTE — Telephone Encounter (Signed)
Patient states she would like to have a copy of her immunization records sent to her via MyChart.  Patient states she would like to have all of them but specifically needs the following information today because she is traveling on Monday:  MMR

## 2022-12-17 NOTE — Telephone Encounter (Signed)
Spoke to pt  and explained to her that we can not upload anything to mychart and also that the only thing I have in my system is a flu vaccine from 2020. Pt stated that she would check her dr office and see if they have records.

## 2022-12-26 ENCOUNTER — Other Ambulatory Visit: Payer: Self-pay | Admitting: Family

## 2022-12-26 DIAGNOSIS — R635 Abnormal weight gain: Secondary | ICD-10-CM

## 2022-12-27 ENCOUNTER — Other Ambulatory Visit (HOSPITAL_BASED_OUTPATIENT_CLINIC_OR_DEPARTMENT_OTHER): Payer: Self-pay

## 2022-12-27 ENCOUNTER — Other Ambulatory Visit (HOSPITAL_COMMUNITY): Payer: Self-pay

## 2022-12-27 MED ORDER — WEGOVY 2.4 MG/0.75ML ~~LOC~~ SOAJ
2.4000 mg | SUBCUTANEOUS | 3 refills | Status: DC
  Filled 2022-12-27: qty 3, 28d supply, fill #0
  Filled 2023-02-07: qty 3, 28d supply, fill #1
  Filled 2023-04-05: qty 3, 28d supply, fill #2
  Filled 2023-05-20: qty 3, 28d supply, fill #3

## 2022-12-28 ENCOUNTER — Other Ambulatory Visit: Payer: Self-pay

## 2022-12-28 ENCOUNTER — Other Ambulatory Visit (HOSPITAL_COMMUNITY): Payer: Self-pay

## 2023-02-07 ENCOUNTER — Other Ambulatory Visit (HOSPITAL_COMMUNITY): Payer: Self-pay

## 2023-04-06 ENCOUNTER — Other Ambulatory Visit (HOSPITAL_COMMUNITY): Payer: Self-pay

## 2023-04-06 ENCOUNTER — Encounter: Payer: Self-pay | Admitting: Pharmacist

## 2023-04-06 ENCOUNTER — Other Ambulatory Visit: Payer: Self-pay

## 2023-05-20 ENCOUNTER — Other Ambulatory Visit (HOSPITAL_COMMUNITY): Payer: Self-pay

## 2023-05-20 ENCOUNTER — Other Ambulatory Visit: Payer: Self-pay

## 2023-07-24 ENCOUNTER — Other Ambulatory Visit: Payer: Self-pay | Admitting: Family

## 2023-07-24 DIAGNOSIS — R635 Abnormal weight gain: Secondary | ICD-10-CM

## 2023-07-25 ENCOUNTER — Other Ambulatory Visit: Payer: Self-pay

## 2023-07-25 MED ORDER — WEGOVY 2.4 MG/0.75ML ~~LOC~~ SOAJ
2.4000 mg | SUBCUTANEOUS | 3 refills | Status: AC
  Filled 2023-07-25: qty 3, 28d supply, fill #0
  Filled 2023-10-06: qty 3, 28d supply, fill #1
  Filled 2023-11-26: qty 3, 28d supply, fill #2
  Filled 2023-12-24: qty 3, 28d supply, fill #3

## 2023-10-06 ENCOUNTER — Other Ambulatory Visit (HOSPITAL_COMMUNITY): Payer: Self-pay

## 2023-10-06 ENCOUNTER — Other Ambulatory Visit: Payer: Self-pay

## 2023-11-28 ENCOUNTER — Other Ambulatory Visit: Payer: Self-pay

## 2023-11-28 ENCOUNTER — Other Ambulatory Visit (HOSPITAL_COMMUNITY): Payer: Self-pay

## 2023-12-26 ENCOUNTER — Other Ambulatory Visit (HOSPITAL_COMMUNITY): Payer: Self-pay

## 2023-12-27 ENCOUNTER — Other Ambulatory Visit: Payer: Self-pay
